# Patient Record
Sex: Female | Born: 1935 | Race: White | Hispanic: No | Smoking: Never smoker
Health system: Southern US, Community
[De-identification: ages and names within clinical notes are randomized; demographics above are authoritative.]

## PROBLEM LIST (undated history)

## (undated) DIAGNOSIS — E039 Hypothyroidism, unspecified: Secondary | ICD-10-CM

## (undated) DIAGNOSIS — M797 Fibromyalgia: Secondary | ICD-10-CM

## (undated) DIAGNOSIS — I779 Disorder of arteries and arterioles, unspecified: Secondary | ICD-10-CM

## (undated) DIAGNOSIS — R0602 Shortness of breath: Secondary | ICD-10-CM

## (undated) DIAGNOSIS — I2510 Atherosclerotic heart disease of native coronary artery without angina pectoris: Secondary | ICD-10-CM

## (undated) DIAGNOSIS — G459 Transient cerebral ischemic attack, unspecified: Secondary | ICD-10-CM

## (undated) DIAGNOSIS — N189 Chronic kidney disease, unspecified: Secondary | ICD-10-CM

## (undated) DIAGNOSIS — E079 Disorder of thyroid, unspecified: Secondary | ICD-10-CM

## (undated) DIAGNOSIS — I10 Essential (primary) hypertension: Secondary | ICD-10-CM

## (undated) DIAGNOSIS — M48061 Spinal stenosis, lumbar region without neurogenic claudication: Secondary | ICD-10-CM

## (undated) DIAGNOSIS — M1990 Unspecified osteoarthritis, unspecified site: Secondary | ICD-10-CM

## (undated) DIAGNOSIS — I2699 Other pulmonary embolism without acute cor pulmonale: Secondary | ICD-10-CM

## (undated) DIAGNOSIS — I219 Acute myocardial infarction, unspecified: Secondary | ICD-10-CM

## (undated) DIAGNOSIS — I209 Angina pectoris, unspecified: Secondary | ICD-10-CM

## (undated) DIAGNOSIS — E7800 Pure hypercholesterolemia, unspecified: Secondary | ICD-10-CM

## (undated) DIAGNOSIS — D649 Anemia, unspecified: Secondary | ICD-10-CM

## (undated) HISTORY — PX: TUBAL LIGATION: SHX77

## (undated) HISTORY — PX: TONSILLECTOMY: SUR1361

## (undated) HISTORY — PX: APPENDECTOMY: SHX54

## (undated) HISTORY — PX: ABDOMINAL HYSTERECTOMY: SHX81

---

## 1967-01-31 DIAGNOSIS — I2699 Other pulmonary embolism without acute cor pulmonale: Secondary | ICD-10-CM

## 1967-01-31 HISTORY — PX: OTHER SURGICAL HISTORY: SHX169

## 1967-01-31 HISTORY — DX: Other pulmonary embolism without acute cor pulmonale: I26.99

## 1970-01-30 DIAGNOSIS — I2510 Atherosclerotic heart disease of native coronary artery without angina pectoris: Secondary | ICD-10-CM

## 1970-01-30 HISTORY — DX: Atherosclerotic heart disease of native coronary artery without angina pectoris: I25.10

## 1997-08-24 ENCOUNTER — Inpatient Hospital Stay (HOSPITAL_COMMUNITY): Admission: EM | Admit: 1997-08-24 | Discharge: 1997-08-25 | Payer: Self-pay | Admitting: Emergency Medicine

## 1998-06-30 ENCOUNTER — Encounter: Payer: Self-pay | Admitting: Surgery

## 1998-07-02 ENCOUNTER — Encounter: Payer: Self-pay | Admitting: Surgery

## 1998-07-02 ENCOUNTER — Ambulatory Visit (HOSPITAL_COMMUNITY): Admission: RE | Admit: 1998-07-02 | Discharge: 1998-07-03 | Payer: Self-pay | Admitting: Surgery

## 1998-11-03 ENCOUNTER — Other Ambulatory Visit: Admission: RE | Admit: 1998-11-03 | Discharge: 1998-11-03 | Payer: Self-pay | Admitting: Family Medicine

## 1998-12-27 ENCOUNTER — Ambulatory Visit (HOSPITAL_COMMUNITY): Admission: RE | Admit: 1998-12-27 | Discharge: 1998-12-27 | Payer: Self-pay | Admitting: Family Medicine

## 2001-11-20 ENCOUNTER — Encounter: Admission: RE | Admit: 2001-11-20 | Discharge: 2001-11-20 | Payer: Self-pay

## 2001-12-05 ENCOUNTER — Ambulatory Visit (HOSPITAL_COMMUNITY): Admission: RE | Admit: 2001-12-05 | Discharge: 2001-12-05 | Payer: Self-pay | Admitting: Neurosurgery

## 2001-12-05 ENCOUNTER — Encounter: Payer: Self-pay | Admitting: Neurosurgery

## 2001-12-20 ENCOUNTER — Encounter: Payer: Self-pay | Admitting: Neurosurgery

## 2001-12-20 ENCOUNTER — Encounter: Admission: RE | Admit: 2001-12-20 | Discharge: 2001-12-20 | Payer: Self-pay | Admitting: Neurosurgery

## 2002-01-03 ENCOUNTER — Encounter: Admission: RE | Admit: 2002-01-03 | Discharge: 2002-01-03 | Payer: Self-pay | Admitting: Neurosurgery

## 2002-01-03 ENCOUNTER — Encounter: Payer: Self-pay | Admitting: Neurosurgery

## 2002-01-16 ENCOUNTER — Encounter: Admission: RE | Admit: 2002-01-16 | Discharge: 2002-01-16 | Payer: Self-pay | Admitting: Neurosurgery

## 2002-01-16 ENCOUNTER — Encounter: Payer: Self-pay | Admitting: Neurosurgery

## 2002-03-05 ENCOUNTER — Encounter: Admission: RE | Admit: 2002-03-05 | Discharge: 2002-03-05 | Payer: Self-pay | Admitting: Family Medicine

## 2002-03-05 ENCOUNTER — Encounter: Payer: Self-pay | Admitting: Family Medicine

## 2002-10-10 ENCOUNTER — Encounter: Admission: RE | Admit: 2002-10-10 | Discharge: 2002-10-10 | Payer: Self-pay | Admitting: Nephrology

## 2002-10-10 ENCOUNTER — Encounter: Payer: Self-pay | Admitting: Nephrology

## 2002-10-21 ENCOUNTER — Encounter: Admission: RE | Admit: 2002-10-21 | Discharge: 2002-10-21 | Payer: Self-pay | Admitting: Neurosurgery

## 2002-10-21 ENCOUNTER — Encounter: Payer: Self-pay | Admitting: Radiology

## 2002-10-21 ENCOUNTER — Encounter: Payer: Self-pay | Admitting: Neurosurgery

## 2002-11-21 ENCOUNTER — Encounter: Payer: Self-pay | Admitting: Neurosurgery

## 2002-11-21 ENCOUNTER — Encounter: Admission: RE | Admit: 2002-11-21 | Discharge: 2002-11-21 | Payer: Self-pay | Admitting: Neurosurgery

## 2002-12-05 ENCOUNTER — Encounter: Admission: RE | Admit: 2002-12-05 | Discharge: 2002-12-05 | Payer: Self-pay | Admitting: Nephrology

## 2002-12-09 ENCOUNTER — Encounter: Admission: RE | Admit: 2002-12-09 | Discharge: 2002-12-09 | Payer: Self-pay | Admitting: Neurosurgery

## 2003-02-10 ENCOUNTER — Encounter: Admission: RE | Admit: 2003-02-10 | Discharge: 2003-02-10 | Payer: Self-pay | Admitting: Nephrology

## 2003-05-19 ENCOUNTER — Encounter: Admission: RE | Admit: 2003-05-19 | Discharge: 2003-05-19 | Payer: Self-pay | Admitting: Obstetrics and Gynecology

## 2003-09-20 ENCOUNTER — Emergency Department (HOSPITAL_COMMUNITY): Admission: EM | Admit: 2003-09-20 | Discharge: 2003-09-20 | Payer: Self-pay | Admitting: Emergency Medicine

## 2004-03-23 ENCOUNTER — Ambulatory Visit: Payer: Self-pay | Admitting: Oncology

## 2004-05-12 ENCOUNTER — Ambulatory Visit: Payer: Self-pay | Admitting: Oncology

## 2005-09-25 ENCOUNTER — Observation Stay (HOSPITAL_COMMUNITY): Admission: EM | Admit: 2005-09-25 | Discharge: 2005-09-26 | Payer: Self-pay | Admitting: Emergency Medicine

## 2006-11-01 ENCOUNTER — Other Ambulatory Visit: Payer: Self-pay | Admitting: Obstetrics and Gynecology

## 2006-11-28 ENCOUNTER — Encounter (INDEPENDENT_AMBULATORY_CARE_PROVIDER_SITE_OTHER): Payer: Self-pay | Admitting: Obstetrics and Gynecology

## 2006-11-28 ENCOUNTER — Inpatient Hospital Stay (HOSPITAL_COMMUNITY): Admission: RE | Admit: 2006-11-28 | Discharge: 2006-12-01 | Payer: Self-pay | Admitting: Obstetrics and Gynecology

## 2008-03-02 ENCOUNTER — Other Ambulatory Visit: Payer: Self-pay | Admitting: Emergency Medicine

## 2008-03-02 ENCOUNTER — Encounter: Payer: Self-pay | Admitting: Vascular Surgery

## 2008-03-02 ENCOUNTER — Ambulatory Visit: Payer: Self-pay | Admitting: Vascular Surgery

## 2008-03-02 ENCOUNTER — Inpatient Hospital Stay (HOSPITAL_COMMUNITY): Admission: AD | Admit: 2008-03-02 | Discharge: 2008-03-16 | Payer: Self-pay | Admitting: Vascular Surgery

## 2008-03-03 ENCOUNTER — Encounter: Payer: Self-pay | Admitting: Vascular Surgery

## 2008-03-04 ENCOUNTER — Encounter: Payer: Self-pay | Admitting: Vascular Surgery

## 2008-03-06 ENCOUNTER — Ambulatory Visit: Payer: Self-pay | Admitting: Physical Medicine & Rehabilitation

## 2008-04-01 ENCOUNTER — Ambulatory Visit: Payer: Self-pay | Admitting: Vascular Surgery

## 2008-05-13 ENCOUNTER — Ambulatory Visit: Payer: Self-pay | Admitting: Vascular Surgery

## 2008-06-10 ENCOUNTER — Ambulatory Visit: Payer: Self-pay | Admitting: Vascular Surgery

## 2008-07-22 ENCOUNTER — Ambulatory Visit: Payer: Self-pay | Admitting: Vascular Surgery

## 2008-09-30 ENCOUNTER — Ambulatory Visit: Payer: Self-pay | Admitting: Vascular Surgery

## 2009-03-24 ENCOUNTER — Ambulatory Visit: Payer: Self-pay | Admitting: Vascular Surgery

## 2009-09-15 ENCOUNTER — Ambulatory Visit: Payer: Self-pay | Admitting: Vascular Surgery

## 2009-11-09 ENCOUNTER — Ambulatory Visit: Payer: Self-pay | Admitting: Vascular Surgery

## 2010-02-19 ENCOUNTER — Encounter: Payer: Self-pay | Admitting: Nephrology

## 2010-02-19 ENCOUNTER — Encounter: Payer: Self-pay | Admitting: Neurosurgery

## 2010-05-11 ENCOUNTER — Encounter (INDEPENDENT_AMBULATORY_CARE_PROVIDER_SITE_OTHER): Payer: Self-pay

## 2010-05-11 DIAGNOSIS — I739 Peripheral vascular disease, unspecified: Secondary | ICD-10-CM

## 2010-05-11 DIAGNOSIS — Z48812 Encounter for surgical aftercare following surgery on the circulatory system: Secondary | ICD-10-CM

## 2010-05-17 LAB — CBC
HCT: 44.6 % (ref 36.0–46.0)
HCT: 22.9 % — ABNORMAL LOW (ref 36.0–46.0)
HCT: 23.8 % — ABNORMAL LOW (ref 36.0–46.0)
HCT: 25.4 % — ABNORMAL LOW (ref 36.0–46.0)
HCT: 25.7 % — ABNORMAL LOW (ref 36.0–46.0)
HCT: 27.3 % — ABNORMAL LOW (ref 36.0–46.0)
HCT: 27.4 % — ABNORMAL LOW (ref 36.0–46.0)
HCT: 27.7 % — ABNORMAL LOW (ref 36.0–46.0)
HCT: 30.3 % — ABNORMAL LOW (ref 36.0–46.0)
HCT: 31.5 % — ABNORMAL LOW (ref 36.0–46.0)
Hemoglobin: 15 g/dL (ref 12.0–15.0)
Hemoglobin: 10.1 g/dL — ABNORMAL LOW (ref 12.0–15.0)
Hemoglobin: 11.1 g/dL — ABNORMAL LOW (ref 12.0–15.0)
Hemoglobin: 6.6 g/dL — CL (ref 12.0–15.0)
Hemoglobin: 8.3 g/dL — ABNORMAL LOW (ref 12.0–15.0)
Hemoglobin: 8.4 g/dL — ABNORMAL LOW (ref 12.0–15.0)
Hemoglobin: 9 g/dL — ABNORMAL LOW (ref 12.0–15.0)
Hemoglobin: 9.6 g/dL — ABNORMAL LOW (ref 12.0–15.0)
MCHC: 33.5 g/dL (ref 30.0–36.0)
MCHC: 33 g/dL (ref 30.0–36.0)
MCHC: 33.1 g/dL (ref 30.0–36.0)
MCHC: 33.3 g/dL (ref 30.0–36.0)
MCHC: 33.4 g/dL (ref 30.0–36.0)
MCHC: 33.7 g/dL (ref 30.0–36.0)
MCHC: 34.2 g/dL (ref 30.0–36.0)
MCHC: 34.3 g/dL (ref 30.0–36.0)
MCHC: 34.5 g/dL (ref 30.0–36.0)
MCHC: 34.6 g/dL (ref 30.0–36.0)
MCHC: 34.7 g/dL (ref 30.0–36.0)
MCHC: 35.1 g/dL (ref 30.0–36.0)
MCV: 91.8 fL (ref 78.0–100.0)
MCV: 86.7 fL (ref 78.0–100.0)
MCV: 86.9 fL (ref 78.0–100.0)
MCV: 87.5 fL (ref 78.0–100.0)
MCV: 87.5 fL (ref 78.0–100.0)
MCV: 87.9 fL (ref 78.0–100.0)
MCV: 87.9 fL (ref 78.0–100.0)
MCV: 88.3 fL (ref 78.0–100.0)
MCV: 90.6 fL (ref 78.0–100.0)
MCV: 92 fL (ref 78.0–100.0)
MCV: 92.7 fL (ref 78.0–100.0)
MCV: 92.9 fL (ref 78.0–100.0)
Platelets: 334 10*3/uL (ref 150–400)
Platelets: 105 10*3/uL — ABNORMAL LOW (ref 150–400)
Platelets: 107 10*3/uL — ABNORMAL LOW (ref 150–400)
Platelets: 141 10*3/uL — ABNORMAL LOW (ref 150–400)
Platelets: 182 10*3/uL (ref 150–400)
Platelets: 213 10*3/uL (ref 150–400)
Platelets: 231 10*3/uL (ref 150–400)
Platelets: 254 10*3/uL (ref 150–400)
Platelets: 268 10*3/uL (ref 150–400)
Platelets: 271 10*3/uL (ref 150–400)
Platelets: 277 10*3/uL (ref 150–400)
Platelets: 289 10*3/uL (ref 150–400)
Platelets: 297 10*3/uL (ref 150–400)
Platelets: 314 10*3/uL (ref 150–400)
Platelets: 429 10*3/uL — ABNORMAL HIGH (ref 150–400)
RBC: 4.86 MIL/uL (ref 3.87–5.11)
RBC: 2.13 MIL/uL — ABNORMAL LOW (ref 3.87–5.11)
RBC: 2.73 MIL/uL — ABNORMAL LOW (ref 3.87–5.11)
RBC: 2.94 MIL/uL — ABNORMAL LOW (ref 3.87–5.11)
RBC: 3.13 MIL/uL — ABNORMAL LOW (ref 3.87–5.11)
RBC: 3.26 MIL/uL — ABNORMAL LOW (ref 3.87–5.11)
RBC: 3.61 MIL/uL — ABNORMAL LOW (ref 3.87–5.11)
RDW: 13.9 % (ref 11.5–15.5)
RDW: 13.9 % (ref 11.5–15.5)
RDW: 14.1 % (ref 11.5–15.5)
RDW: 14.3 % (ref 11.5–15.5)
RDW: 15.1 % (ref 11.5–15.5)
RDW: 15.2 % (ref 11.5–15.5)
RDW: 15.3 % (ref 11.5–15.5)
RDW: 15.3 % (ref 11.5–15.5)
RDW: 15.4 % (ref 11.5–15.5)
RDW: 15.5 % (ref 11.5–15.5)
RDW: 15.7 % — ABNORMAL HIGH (ref 11.5–15.5)
RDW: 16 % — ABNORMAL HIGH (ref 11.5–15.5)
WBC: 11.9 10*3/uL — ABNORMAL HIGH (ref 4.0–10.5)
WBC: 12.7 10*3/uL — ABNORMAL HIGH (ref 4.0–10.5)
WBC: 13.6 10*3/uL — ABNORMAL HIGH (ref 4.0–10.5)
WBC: 15.2 10*3/uL — ABNORMAL HIGH (ref 4.0–10.5)
WBC: 15.9 10*3/uL — ABNORMAL HIGH (ref 4.0–10.5)
WBC: 17.7 10*3/uL — ABNORMAL HIGH (ref 4.0–10.5)
WBC: 18.4 10*3/uL — ABNORMAL HIGH (ref 4.0–10.5)
WBC: 22.7 10*3/uL — ABNORMAL HIGH (ref 4.0–10.5)
WBC: 28.8 10*3/uL — ABNORMAL HIGH (ref 4.0–10.5)
WBC: 8.5 10*3/uL (ref 4.0–10.5)
WBC: 8.6 10*3/uL (ref 4.0–10.5)

## 2010-05-17 LAB — PROTIME-INR
INR: 1 (ref 0.00–1.49)
INR: 1.1 (ref 0.00–1.49)
INR: 1.3 (ref 0.00–1.49)
INR: 1.4 (ref 0.00–1.49)
INR: 1.6 — ABNORMAL HIGH (ref 0.00–1.49)
INR: 2.2 — ABNORMAL HIGH (ref 0.00–1.49)
INR: 2.2 — ABNORMAL HIGH (ref 0.00–1.49)
INR: 2.5 — ABNORMAL HIGH (ref 0.00–1.49)
Prothrombin Time: 13.1 seconds (ref 11.6–15.2)
Prothrombin Time: 16.5 seconds — ABNORMAL HIGH (ref 11.6–15.2)
Prothrombin Time: 17.5 seconds — ABNORMAL HIGH (ref 11.6–15.2)
Prothrombin Time: 19.5 seconds — ABNORMAL HIGH (ref 11.6–15.2)
Prothrombin Time: 25.9 seconds — ABNORMAL HIGH (ref 11.6–15.2)
Prothrombin Time: 26.1 seconds — ABNORMAL HIGH (ref 11.6–15.2)
Prothrombin Time: 27.9 seconds — ABNORMAL HIGH (ref 11.6–15.2)
Prothrombin Time: 28.9 seconds — ABNORMAL HIGH (ref 11.6–15.2)

## 2010-05-17 LAB — CROSSMATCH
ABO/RH(D): A NEG
ABO/RH(D): A NEG
Antibody Screen: NEGATIVE
Antibody Screen: NEGATIVE

## 2010-05-17 LAB — HEPARIN LEVEL (UNFRACTIONATED)
Heparin Unfractionated: 0.24 IU/mL — ABNORMAL LOW (ref 0.30–0.70)
Heparin Unfractionated: 0.24 IU/mL — ABNORMAL LOW (ref 0.30–0.70)
Heparin Unfractionated: 0.26 IU/mL — ABNORMAL LOW (ref 0.30–0.70)
Heparin Unfractionated: 0.31 IU/mL (ref 0.30–0.70)
Heparin Unfractionated: 0.35 IU/mL (ref 0.30–0.70)
Heparin Unfractionated: 0.41 IU/mL (ref 0.30–0.70)
Heparin Unfractionated: 0.42 IU/mL (ref 0.30–0.70)
Heparin Unfractionated: <0.1 IU/mL — ABNORMAL LOW (ref 0.30–0.70)
Heparin Unfractionated: >2 IU/mL — ABNORMAL HIGH (ref 0.30–0.70)

## 2010-05-17 LAB — URINALYSIS, ROUTINE W REFLEX MICROSCOPIC
Glucose, UA: NEGATIVE mg/dL
Ketones, ur: NEGATIVE mg/dL
Ketones, ur: NEGATIVE mg/dL
Leukocytes, UA: NEGATIVE
Nitrite: NEGATIVE
Protein, ur: NEGATIVE mg/dL
Specific Gravity, Urine: 1.033 — ABNORMAL HIGH (ref 1.005–1.030)
Urobilinogen, UA: 1 mg/dL (ref 0.0–1.0)
pH: 7 (ref 5.0–8.0)

## 2010-05-17 LAB — CARDIOLIPIN ANTIBODIES, IGG, IGM, IGA
Anticardiolipin IgA: 7 [APL'U] — ABNORMAL LOW (ref <13)
Anticardiolipin IgM: <7 [MPL'U] — ABNORMAL LOW (ref <10)

## 2010-05-17 LAB — BASIC METABOLIC PANEL
BUN: 16 mg/dL (ref 6–23)
BUN: 40 mg/dL — ABNORMAL HIGH (ref 6–23)
CO2: 16 mEq/L — ABNORMAL LOW (ref 19–32)
CO2: 22 mEq/L (ref 19–32)
Calcium: 6.8 mg/dL — ABNORMAL LOW (ref 8.4–10.5)
Calcium: 7 mg/dL — ABNORMAL LOW (ref 8.4–10.5)
Calcium: 7.3 mg/dL — ABNORMAL LOW (ref 8.4–10.5)
Chloride: 100 mEq/L (ref 96–112)
Chloride: 108 mEq/L (ref 96–112)
Creatinine, Ser: 1.08 mg/dL (ref 0.4–1.2)
Creatinine, Ser: 1.55 mg/dL — ABNORMAL HIGH (ref 0.4–1.2)
Creatinine, Ser: 1.95 mg/dL — ABNORMAL HIGH (ref 0.4–1.2)
GFR calc Af Amer: 29 mL/min — ABNORMAL LOW (ref >60)
GFR calc Af Amer: 39 mL/min — ABNORMAL LOW (ref >60)
GFR calc non Af Amer: 24 mL/min — ABNORMAL LOW (ref >60)
GFR calc non Af Amer: 37 mL/min — ABNORMAL LOW (ref >60)
Glucose, Bld: 95 mg/dL (ref 70–99)
Potassium: 4.1 mEq/L (ref 3.5–5.1)
Potassium: 4.6 mEq/L (ref 3.5–5.1)
Sodium: 126 mEq/L — ABNORMAL LOW (ref 135–145)
Sodium: 133 mEq/L — ABNORMAL LOW (ref 135–145)
Sodium: 134 mEq/L — ABNORMAL LOW (ref 135–145)

## 2010-05-17 LAB — COMPREHENSIVE METABOLIC PANEL
ALT: 23 U/L (ref 0–35)
AST: 24 U/L (ref 0–37)
Albumin: 3.6 g/dL (ref 3.5–5.2)
Alkaline Phosphatase: 62 U/L (ref 39–117)
BUN: 40 mg/dL — ABNORMAL HIGH (ref 6–23)
CO2: 25 mEq/L (ref 19–32)
Calcium: 9.3 mg/dL (ref 8.4–10.5)
Chloride: 105 mEq/L (ref 96–112)
Creatinine, Ser: 1.38 mg/dL — ABNORMAL HIGH (ref 0.4–1.2)
GFR calc Af Amer: 44 mL/min — ABNORMAL LOW (ref >60)
GFR calc non Af Amer: 36 mL/min — ABNORMAL LOW (ref >60)
Glucose, Bld: 98 mg/dL (ref 70–99)
Potassium: 4.3 mEq/L (ref 3.5–5.1)
Sodium: 139 mEq/L (ref 135–145)
Total Bilirubin: 0.9 mg/dL (ref 0.3–1.2)
Total Protein: 6.6 g/dL (ref 6.0–8.3)

## 2010-05-17 LAB — RENAL FUNCTION PANEL
CO2: 18 mEq/L — ABNORMAL LOW (ref 19–32)
Chloride: 106 mEq/L (ref 96–112)
Glucose, Bld: 127 mg/dL — ABNORMAL HIGH (ref 70–99)
Phosphorus: 4 mg/dL (ref 2.3–4.6)
Potassium: 4.7 mEq/L (ref 3.5–5.1)
Sodium: 132 mEq/L — ABNORMAL LOW (ref 135–145)

## 2010-05-17 LAB — LUPUS ANTICOAGULANT PANEL
PTT Lupus Anticoagulant: 200 secs — ABNORMAL HIGH (ref 36.3–48.8)
PTTLA 4:1 Mix: 200 secs — ABNORMAL HIGH (ref 36.3–48.8)
PTTLA Confirmation: 5.5 secs (ref <8.0)
dRVVT Incubated 1:1 Mix: 39 secs (ref 36.1–47.0)

## 2010-05-17 LAB — URINE MICROSCOPIC-ADD ON

## 2010-05-17 LAB — DIFFERENTIAL
Eosinophils Absolute: 0.2 10*3/uL (ref 0.0–0.7)
Eosinophils Relative: 1 % (ref 0–5)
Monocytes Absolute: 1.8 10*3/uL — ABNORMAL HIGH (ref 0.1–1.0)
Neutrophils Relative %: 58 % (ref 43–77)

## 2010-05-17 LAB — PROTEIN C ACTIVITY: Protein C Activity: 114 % (ref 75–133)

## 2010-05-17 LAB — BETA-2-GLYCOPROTEIN I ABS, IGG/M/A
Beta-2 Glyco I IgG: <4 U/mL (ref <20)
Beta-2-Glycoprotein I IgA: 4 U/mL (ref <10)
Beta-2-Glycoprotein I IgM: <4 U/mL (ref <10)

## 2010-05-17 LAB — HOMOCYSTEINE: Homocysteine: 8.8 umol/L (ref 4.0–15.4)

## 2010-05-17 LAB — ANTITHROMBIN III: AntiThromb III Func: 72 % — ABNORMAL LOW (ref 76–126)

## 2010-05-17 LAB — FACTOR 5 LEIDEN

## 2010-05-17 LAB — SAMPLE TO BLOOD BANK

## 2010-05-17 LAB — PREPARE RBC (CROSSMATCH)

## 2010-05-17 LAB — APTT
aPTT: 25 seconds (ref 24–37)
aPTT: 102 seconds — ABNORMAL HIGH (ref 24–37)

## 2010-05-17 LAB — ABO/RH: ABO/RH(D): A NEG

## 2010-05-17 LAB — PROTEIN S ACTIVITY: Protein S Activity: 55 % — ABNORMAL LOW (ref 69–129)

## 2010-05-17 LAB — GLUCOSE, CAPILLARY: Glucose-Capillary: 111 mg/dL — ABNORMAL HIGH (ref 70–99)

## 2010-05-17 LAB — PROTEIN S, TOTAL: Protein S Ag, Total: 52 % — ABNORMAL LOW (ref 70–140)

## 2010-06-14 NOTE — Assessment & Plan Note (Signed)
OFFICE VISIT   Katherine Cuevas, Katherine Cuevas  DOB:  Oct 30, 1919                                       04/01/2008  FAOZH#:[id number]   The patient is an 77 year old female who underwent embolectomy of her  right leg in early February with bilateral fasciotomies.  She presents  for followup in the office today.  She was discharged to a nursing home  with [facility] therapy to shrink the fasciotomy wounds.  She states that she  is beginning to walk some with a walker but still has a fair amount of  numbness in her right heel.  She has significantly improved since her  discharge from the hospital.   PHYSICAL EXAMINATION:  On physical exam today blood pressure is 120/65  in the left arm, pulse is 60 and regular.  The medial and lateral  fasciotomy sites are healing well with good granulation tissue in the  base.  She still has some edema in the right foot but she has a 2+  palpable right dorsalis pedis pulse.  She is on Coumadin and states that  her levels have been good.   Overall, the patient continues to heal.  The blisters on her foot have  totally resolved.  She will continue VAC therapy and follow up with me  in one month's time.   Janetta Hora. Fields, MD  Electronically Signed   CEF/MEDQ  D:  04/01/2008  T:  04/02/2008  Job:  [id number]

## 2010-06-14 NOTE — Assessment & Plan Note (Signed)
OFFICE VISIT   UNITA, DETAMORE  DOB:  [birth year]                                       07/22/2008  ZOXWR#:[id number]   The patient returns for followup today.  She was last seen in May of  2010.  She underwent thrombectomy of her right popliteal and tibial  arteries in February of 2010.   On exam today her fasciotomies have completely healed at this point.  Her feet are pink, warm and well-perfused.  Overall she is doing well.  She will follow up in six months' time for repeat ABIs.  She will  continue to take her Coumadin 4 mg daily.  Blood pressure today is  148/79, pulse 83 and regular.   Janetta Hora. Fields, MD  Electronically Signed   CEF/MEDQ  D:  07/22/2008  T:  07/23/2008  Job:  [id number]

## 2010-06-14 NOTE — Consult Note (Signed)
NAME:  Katherine Cuevas, Katherine Cuevas NO.:  [id number]   MEDICAL RECORD NO.:  [id number]          PATIENT TYPE:  INP   LOCATION:  3316                         FACILITY:  [facility]   PHYSICIAN:  Peter M. Swaziland, M.D.  DATE OF BIRTH:  Feb 14, 1919   DATE OF CONSULTATION:  03/03/2008  DATE OF DISCHARGE:                                 CONSULTATION   HISTORY OF PRESENT ILLNESS:  Katherine Cuevas is a pleasant 77 year old white  female who is well known to me.  She was last seen in October 2008.  She  was admitted yesterday with acute ischemia to her right leg.  Arteriogram was consistent with embolus to the right SFA.  She underwent  embolectomy yesterday.  Last night, she had continued bleeding from her  fasciotomy site, and her heparin was discontinued.  Today, she developed  recurrent thrombosis of the right leg, and underwent repeat surgery with  thrombectomy at the right popliteal and tibial vessels with vein patch  angioplasty of the right peroneal trunk.  Katherine Cuevas has no known  history of arterial embolus.  She has no history of atrial fibrillation.  She has had chest pain off and on over the years.  She had normal  cardiac catheterizations in 1996 and 1999.  She had an adenosine  Cardiolite study in October 2008, that was normal with an ejection  fraction of 72%.  Her last echocardiogram in 2002 was normal.  She did  complain of some palpitations in 2004, but Holter monitor at that time  showed only rare PACs and PVCs.   PAST MEDICAL HISTORY:  1. Hypothyroidism.  2. History of pulmonary emboli, and she subsequently underwent a vena      cava ligation.  3. Gout.  4. GERD.  5. Hypertension.  6. Lumbar stenosis.  7. Chronic renal insufficiency.  8. Fibromyalgia.   PRIOR SURGERIES:  TAHBSO, vena cava ligation, cholecystectomy,  appendectomy, T&A, and thyroidectomy.   ALLERGIES:  She is allergic to CODEINE, MORPHINE, and DILAUDID.   MEDICATIONS PRIOR TO ADMISSION:  1.  Allopurinol 300 mg per day.  2. Multivitamin daily.  3. Lescol 80 mg per day.  4. Norvasc 5 mg daily.  5. Synthroid 50 mcg per day.  6. Torsemide 20 mg per day.  7. Prilosec daily.   SOCIAL HISTORY:  The patient lives alone.  She has 6 children.  She  denies tobacco or alcohol use.   FAMILY HISTORY:  Father died at age 4 of nephritis.  Mother died in her  46s with coronary disease.   REVIEW OF SYSTEMS:  Otherwise unobtainable as the patient is sedated  immediately, postoperatively.   PHYSICAL EXAMINATION:  GENERAL:  She is a well-developed white female  who is sedated.  She appears pale.  HEENT:  She is normocephalic and atraumatic.  Her pupils are equal,  round, and reactive to light.  NECK:  Supple without JVD, adenopathy, thyromegaly, or bruits.  She has  an old thyroidectomy scar.  LUNGS:  Clear anteriorly.  CARDIAC:  Regular rate and rhythm with the grade 1-2/6 high pitch apical  systolic murmur.  ABDOMEN:  Soft and nontender.  Bowel sounds are positive.  EXTREMITIES:  Her right leg is dressed with the surgical dressing.  She  has good posterior tibial pulses and absent DP pulses.  Her left foot is  warm and dry with good pulses.  NEUROLOGIC:  Unobtainable other than globally she moves all extremities.   LABORATORY DATA:  ECG showed normal sinus rhythm, left anterior  fascicular block, right bundle-branch block.  These findings are  chronic.  White count is 22,700, hemoglobin 7.9, hematocrit 23.8, and  platelets 213,000.  Protime was 19.3 with an INR of 1.6.  Heparin level  is greater than 2.  Sodium is 133, potassium 4.6, chloride 100, CO2 of  24, BUN 40, creatinine 1.55, and glucose of 134.   IMPRESSION:  1. Acute arterial embolus to the right leg.  Source is unknown.  No      known history of atrial fibrillation.  2. Chronic chest pain with negative cardiac workup in the past.  3. Hypertension.  4. Gout.  5. Gastroesophageal reflux disease.   PLAN:  We will  obtain an echocardiogram to evaluate possible cardiac  source of embolus.  We will also obtain hypercoagulable panel.  May need  to consider transesophageal echo at some point depending on her  recovery.  We need to monitor for any arrhythmia.  We will need to  decide whether the patient needs to go on Coumadin therapy or not versus  antiplatelet therapy.  We will await her workup before making this  decision.           ______________________________  Peter M. [place], M.D.     PMJ/MEDQ  D:  03/03/2008  T:  03/04/2008  Job:  [id number]   cc:   Janetta Hora. Darrick Penna, MD

## 2010-06-14 NOTE — Assessment & Plan Note (Signed)
OFFICE VISIT   LAKYLA, BISWAS  DOB:  [birth year]                                       05/13/2008  WUJWJ#:[id number]   The patient returns for followup today.  She had an embolectomy of her  right leg on March 03, 2008.  She returns today for wound check of her  fasciotomy sites.   PHYSICAL EXAMINATION:  VITAL SIGNS:  Today, blood pressure 174/75 in the  left arm, pulse is 88 and regular.  EXTREMITIES:  Lower extremity exam shows that the lateral fasciotomy has  now completely healed.  There is a 3 x 8-cm open wound on the medial  fasciotomy site, but this has a very good looking bed of granulation  tissue and this is essentially flat with the normal epithelium  surrounding it.  Silver nitrate was applied to the granulation tissue  today so that the skin can continue to further epithelialize over this.  Her left foot has no ulcerations.  She has biphasic posterior tibial  Doppler flow and monophasic dorsalis pedis Doppler flow.  Blisters on  her leg are completely healed, although she does have two discolored  areas of skin where these blisters healed up.   We will stop her VAC dressing at this time and go to hydrogel dressings.  She will follow up with me in one months' time.  Hopefully, all of her  wounds will be completely healed at that time.  We will need to get ABIs  on her in May.   Janetta Hora. Fields, MD  Electronically Signed   CEF/MEDQ  D:  05/13/2008  T:  05/14/2008  Job:  [id number]

## 2010-06-14 NOTE — H&P (Signed)
NAME:  Katherine Cuevas, COBERN                ACCOUNT NO.:  [id number]   MEDICAL RECORD NO.:  [id number]          PATIENT TYPE:  AMB   LOCATION:  SDC                           FACILITY:  [facility]   PHYSICIAN:  Charles A. Delcambre, MDDATE OF BIRTH:  15-Nov-1919   DATE OF ADMISSION:  11/05/2006  DATE OF DISCHARGE:                              HISTORY & PHYSICAL   Patient is to be admitted on October 6th to undergo transabdominal  hysterectomy and bilateral salpingo-oophorectomy for a pelvic mass that  has been followed but is growing slowly.  She is an 77 year old.  Ultrasound has shown this mass to increase very slowly, 4+ cm, 5 cm, 5.5  cm, now 5.7 cm with a tiny echogenic area, according to ultrasound on  September 18, 2006.  We did draw serum markers.  CA-125 was 7.1 on August 27, 2006.  CA-125 on May 06, 2003 was 9.6.  I have discussed this with Dr.  Jenita Seashore in the past and recently with Dr. Clifton James, GYN oncologist.  She  feels this is very likely a benign lesion and recommends that I proceed  with surgery, should the patient want to have surgery here.  I did  discuss the patient's surgery with Dr. Leilani Able at [facility], who  agreed that we could do her surgery at [facility] as well with no major  medical issues and generally being of good health, see medical history,  as noted above.   She has left lower quadrant pain now, not severe in nature but  increasing, and if she does have severe pain, we will proceed with  surgery.   PAST MEDICAL HISTORY:  1. Fibromyalgia.  2. Lumbar vertebral stenosis with a protruding disk.  3. Chronic back pain.  4. Chronic renal disease, she states, but creatinine has recently been      1.8 or 1.5.  5. Hypertension.  6. Hypercholesterolemia.  7. Gout.  8. Hypothyroidism after thyroidectomy.  9. Sudden endocardial MI in 1972.  She recently had cardiac      catheterization about three years, which was negative.  She has no      chest pain at this time.   PAST  SURGICAL HISTORY:  1. Tonsillectomy.  2. Appendectomy.  3. Tubal ligation.  4. She has a vena cava umbrella placement secondary to a pulmonary      embolus in 1969.  She is on no Coumadin.   MEDICATIONS:  1. Armour thyroid 30 mg daily.  2. Furosemide 20 mg daily.  3. Lescol XL 80 mg daily.  4. Allopurinol 300 mg daily.  5. Norvasc 5 mg daily.  6. Multivitamin with iron.  7. Baby aspirin 81 mg per day.  She will stop this at this time.  8. Tramadol 50 mg q.i.d. as needed for pain on a chronic basis.  9. Prilosec 1 tablet a day.   ALLERGIES:  Sensitive to CODEINE, MORPHINE, and DILAUDID.  Not strictly  allergic by history.   SOCIAL HISTORY:  Widowed.  Not sexually active.  Denies tobacco,  ethanol, or drug use.   FAMILY HISTORY:  Father  deceased, age 83, of glomerular nephritis.  Mother with congestive heart failure.  Both deceased at ages 6 and 20,  respectively.  Sister deceased, age 24, congestive heart failure.  Brother deceased, age 49, congestive heart failure.  Brother and sister  each had diabetes and one of them had renal failure as well.  No family  history of ovarian cancer.   REVIEW OF SYSTEMS:  She has some fluid collection in the left adnexa but  no ascites on ultrasound.  Denies fevers or chills, rashes, lesions,  headaches, dizziness.  She does have some seasonal allergies.  No chest  pain, shortness of breath, or wheezing.  No diarrhea, constipation,  bleeding, melena, hematochezia, urgency, frequency, dysuria,  incontinence, hematuria, galactorrhea, or emotional changes.   PHYSICAL EXAMINATION:  Alert and oriented x3.  In no distress.  Blood pressure 124/70, heart rate 88, respirations 20.  Afebrile.  HEENT:  Grossly within normal limits.  NECK:  Supple without thyromegaly or adenopathy.  LUNGS:  Clear bilaterally.  BREASTS:  No masses, tenderness, discharge, skin, or nipple changes.  HEART:  Regular rate and rhythm without murmur.  ABDOMEN:  Mild left  lower quadrant tenderness to deep palpation.  No  mass palpable.  No guarding, rebound, or peritoneal signs.  No  hepatosplenomegaly.  PELVIC:  Vaginal probe ultrasound had significant discomfort.  Even  caused some bleeding after the ultrasound.  A speculum was placed.  It  does appear that she would have a large enough introitus to introduce a  vaginal probe ultrasound.  The vagina is hypoestrogenic.   LABORATORY:  CA-125 as noted above.  BUN 26, creatinine 1.5.  Glomerular  filtration rate, Caucasian, 33 ml/min.   ASSESSMENT:  Increasing pelvic mass and now with symptomatic with pelvic  pain.  Discussed with GYN oncology, and they consider that she is a low  risk candidate as far as cancer goes and would consent, as patient  requests, to have surgery done at [facility].  Dr. Arby Barrette also agrees that  patient could be done at [facility].  Of course, we will get a preop prior  to surgery, and if they pick up something of significance, we will move  her surgery to [facility].  All questions are answered.  She gives  informed consent except for the risk of infection, bleeding, bowel and  bladder damage, blood product risks, including hepatitis and HIV  exposure, ureteral damage, the possibility of cancer, limited at her age  to go onto staging adenopathy, but we could proceed with omentectomy,  biopsies, and washings.  All questions are answered, and we will proceed  with surgery.  We will have her do a bowel prep with Moviprep prior to  surgery.  I start this about 3 p.m. and thereafter clear liquids until  surgery.  She is currently scheduled for 11:30.  She can have clear  liquids up until 5 in the morning.  Otherwise, per anesthesia.   A preoperative CMET was done today.  We will get a copy to the hospital.  At the time of surgery, CBC and type and screen will be done.  SCDs will  be used to the thigh.  She will follow up as directed.      Charles A. Sydnee Cabal, MD  Electronically  Signed     CAD/MEDQ  D:  10/24/2006  T:  10/24/2006  Job:  [id number]

## 2010-06-14 NOTE — Op Note (Signed)
NAME:  Katherine Cuevas, Katherine Cuevas NO.:  [id number]   MEDICAL RECORD NO.:  [id number]          PATIENT TYPE:  INP   LOCATION:  3316                         FACILITY:  [facility]   PHYSICIAN:  Janetta Hora. Fields, MD  DATE OF BIRTH:  10-Feb-1919   DATE OF PROCEDURE:  DATE OF DISCHARGE:                               OPERATIVE REPORT   PROCEDURE:  Aortogram with right lower extremity runoff.   PREOPERATIVE DIAGNOSIS:  Ischemia right lower extremity.   POSTOPERATIVE DIAGNOSIS:  Ischemia right lower extremity.   ANESTHESIA:  Local.   OPERATIVE DETAILS:  After obtaining informed consent, the patient was  taken to the PV lab.  The patient was placed in supine position on the  angio table.  Both groins were prepped and draped in usual sterile  fashion.  Local anesthesia was infiltrated over the left common femoral  artery.  A Majestic needle was used to cannulate the left common femoral  artery and a 0.035 Wholey wire was advanced into the abdominal aorta  under fluoroscopic guidance.  Next, a 5-French sheath was placed over  the guidewire in the left common femoral artery.  This was thoroughly  flushed with heparinized saline.  A 5-French pigtail catheter was then  placed over the guidewire and advanced up into the abdominal aorta.  Abdominal aortogram was obtained.  This shows bilateral single patent  left and right renal arteries.  The infrarenal abdominal aorta is smooth  in character with mild tortuosity.  The left common internal and  external ileac arteries are patent.  The right common internal and  external ileac arteries are patent.  Next, a right lower extremity  runoff view was obtained.  This was done by first removing the 5-French  pigtail catheter.  A 5-French crossover catheter was used to selectively  catheterize the right common ileac artery.  The [facility] wire was then  advanced down into the right external ileac artery under fluoroscopic  guidance.  Crossover catheter  was removed and a 4-French straight  catheter was placed over the guidewire in the proximal right external  ileac artery.  Right lower extremity arteriogram was obtained.  This  shows that the right common femoral and profunda femoris arteries are  patent.  Proximal portion of the right superficial femoral artery is  patent.  However, there was a large amount of what appears to be fresh  appearing thrombus or embolic material throughout the entire course of  the right superficial femoral artery.  The popliteal artery does fill  with contrast.  The anterior tibial artery is patent throughout its  course.  There is an abrupt cutoff sign at the proximal aspect of the  tibioperoneal trunk suggesting that this has embolic material within it  as well.  Next, the 4-French straight catheter was removed over a  guidewire.  The 5-French sheath was then pulled and hemostasis obtained  with direct pressure.  The patient tolerated the procedure well and  there were no complications.  The patient was taken to the preoperative  holding area for the operating room post procedure.   OPERATIVE  FINDINGS:  1. Right superficial femoral artery embolus.  2. Right tibioperoneal trunk embolus.      Janetta Hora. Fields, MD  Electronically Signed     CEF/MEDQ  D:  03/02/2008  T:  03/03/2008  Job:  [id number]

## 2010-06-14 NOTE — Assessment & Plan Note (Signed)
OFFICE VISIT   Katherine Cuevas, Katherine Cuevas  DOB:  02/28/1919                                       11/09/2009  ZOXWR#:[id number]   Patient is a patient of Dr. Miguel Aschoff, who called today because the  patient is complaining of acute onset of pain in her right calf last  evening.  She has been able to ambulate, states the calf does not feel  right.  She has a complicated vascular history and was treated by Dr.  Darrick Penna in February 2011 where she had a thrombectomy of her right  popliteal and tibial arteries with vein patch angioplasty,  intraoperative thrombolysis, and required a fasciotomy.  Eventually that  all healed, and most recently her ABIs were normal in August of this  year.  She denies any severe pain in the toes or numbness other than the  chronic numbness which she experienced since her surgery.   CHRONIC MEDICAL PROBLEMS:  1. Coronary artery disease, previous SEMI.  2. Hypertension.  3. Hyperlipidemia.  4. Negative for stroke, COPD.   SOCIAL HISTORY:  She does not use tobacco or alcohol.  She lives alone,  has 6 children.   FAMILY HISTORY:  Father died of nephritis.  Mother died of coronary  artery disease.   REVIEW OF SYSTEMS:  Denies any chest pain, dyspnea on exertion,  orthopnea, chronic bronchitis, hemoptysis, pulmonary symptoms.  All  other systems are negative in review of systems.   PHYSICAL EXAMINATION:  Blood pressure 147/85, heart rate 90, temperature  97.4.  Generally, she is an elderly female in no apparent distress,  alert and oriented x3.  HEENT:  Normal for age.  EOMs intact.  Chest:  Clear to auscultation.  No rhonchi or wheezing.  Cardiovascular:  Regular rhythm.  No murmurs.  Abdomen:  Obese.  No palpable masses.  Lower extremity exam reveals 3+ femoral pulses.  Right foot has a 2+  dorsalis pedis.  Left foot has a 1-2+ dorsalis pedis.  Both feet are  pink and well-perfused.  The fasciotomy sites in the right leg remain  well-healed.  She does have slight decreased sensation in the right foot  with good motion.   Today I ordered a lower extremity exam with segmental pressures and  ABIs.  These are unchanged from her study 2 months ago with triphasic  waveforms and normal ABIs.   I reassured her regarding these findings.  She will return on a regular  schedule in 6 months for protocol follow-up for lower extremity disease.     Quita Skye Hart Rochester, M.D.  Electronically Signed   JDL/MEDQ  D:  11/09/2009  T:  11/10/2009  Job:  [id number]

## 2010-06-14 NOTE — Op Note (Signed)
NAME:  Katherine Cuevas, Katherine Cuevas NO.:  [id number]   MEDICAL RECORD NO.:  [id number]          PATIENT TYPE:  INP   LOCATION:  3316                         FACILITY:  [facility]   PHYSICIAN:  Janetta Hora. Fields, MD  DATE OF BIRTH:  [birth year]   DATE OF PROCEDURE:  03/02/2008  DATE OF DISCHARGE:                               OPERATIVE REPORT   PROCEDURES:  1. Embolectomy of right superficial femoral artery.  2. Embolectomy of anterior tibial artery and tibioperoneal trunk right      leg.  3. Four compartment fasciotomy  4. Intraoperative arteriogram x1.   PREOPERATIVE DIAGNOSIS:  Embolus right leg.   POSTOPERATIVE DIAGNOSIS:  Embolus right leg.   ANESTHESIA:  General.   ASSISTANT:  Wilmon Arms, PA-C   FINDINGS:  Embolus right superficial femoral artery and tibioperoneal  trunk.   SPECIMENS:  Embolus right leg.   OPERATIVE DETAILS:  After obtaining informed consent, the patient was  taken to the operating room.  The patient was placed in supine position  on the  operating room table.  After induction of general anesthesia and  endotracheal intubation, the patient's entire right lower extremity was  prepped and draped in usual sterile fashion.  A longitudinal incision  was made in the right leg on the medial aspect just below the knee.  Incision was carried down through the subcutaneous tissues.  The greater  saphenous vein was dissected free circumferentially and small side  branches ligated and divided between silk ties.  Next, the vein was  reflected posteriorly and the incision was deepened down through the  fascia and the popliteal space was entered.  The popliteal artery was  dissected free circumferentially.  Small side branches were ligated  between clips.  Dissection was carried down to the takeoff of the  anterior tibial artery and tibioperoneal trunk.  Vessel loops were  placed around these vessels.  There was no pulse within the popliteal  artery.  The  vessel loop was placed around this as well.  The patient  had been given 7000 units of intravenous heparin on induction.  The  patient was given an additional 3000 units of heparin during the course  of the case.  Proximal control of the popliteal artery was obtained with  a fine bulldog clamp.  The anterior tibial artery and tibioperoneal  trunk was also controlled with fine bulldog clamps.  A transverse  arteriotomy was then made in the popliteal artery just above the takeoff  of the anterior tibial artery.  A #4 Fogarty catheter was then pressed  proximally all the way up to the level of the groin.  Multiple passes  were made with return of a large amount of thrombotic and embolic  material.  This was sent to pathology as a specimen.  Fogarty catheter  was passed until two clean passes were obtained.  This was then  thoroughly flushed.  This was then reoccluded proximally with fine  bulldog clamp.  #4 Fogarty catheter was then passed down the  tibioperoneal trunk in multiple passes and a large amount of thrombotic  and embolic material was removed.  This was performed on multiple passes  until two clean passes were obtained.  This was then reoccluded with a  fine bulldog clamp.  Next, an attempt was made to selectively  catheterize the anterior tibial artery.  A #3 Fogarty catheter would  only pass for approximately 10 cm.  Therefore, a #3 and #2 Fogarty  catheters were attempted to pass down the anterior tibial artery but  these were also passed for approximately 10 cm and was thought to be due  to intense spasm.  There was very good almost pulsatile backbleeding  from the anterior tibial artery.  At this point, this was reoccluded  with a fine bulldog clamp and the arteriotomy closed with interrupted 7-  0 Prolene sutures.  Just prior to completion of anastomosis, this was  fore-bled, back-bled and thoroughly flushed.  Anastomosis was secured,  clamps released, there was a pulsatile  flow in the popliteal artery  immediately.  There was biphasic Doppler flow in the anterior tibial and  tibioperoneal trunk immediately.  There was also a biphasic posterior  tibial Doppler signal, a monophasic dorsalis pedis Doppler, her anterior  tibial Doppler signal at the ankle and a triphasic peroneal Doppler  signal at the ankle.  Next, intraoperative arteriogram was performed by  introducing a 21 gauge butterfly needle into the mid popliteal artery  with inflow occlusion.  Contrast arteriogram was obtained with 15 mL of  contrast.  This shows a patent popliteal artery with patent anterior  tibial, posterior tibial and peroneal arteries all the way to the level  of the ankle.  The 21-gauge butterfly needle was then removed and a hole  repaired with a single 7-0 Prolene suture.  Next, a fasciotomy was  performed on the medial aspect of the leg to fully open the posterior  and posterior deep compartments.  A lateral and anterior compartment  fasciotomy was then also performed through a separate incision on the  lateral aspect of the leg.  There was some swelling of the anterior  compartment.  The muscle tissues of all four compartments however were  viable in appearance.  The patient tolerated the procedure well and  there were no complications.  Instrument, sponge, and needle counts were  correct at the end of the case.  The patient had a palpable dorsalis  pedis and posterior tibial pulse at the end of the case.      Janetta Hora. Fields, MD  Electronically Signed     CEF/MEDQ  D:  03/02/2008  T:  03/03/2008  Job:  [id number]

## 2010-06-14 NOTE — Op Note (Signed)
NAME:  Katherine Cuevas, FUNNELL NO.:  [id number]   MEDICAL RECORD NO.:  [id number]          PATIENT TYPE:  INP   LOCATION:  3316                         FACILITY:  [facility]   PHYSICIAN:  Janetta Hora. Fields, MD  DATE OF BIRTH:  Mar 20, 1919   DATE OF PROCEDURE:  03/03/2008  DATE OF DISCHARGE:                               OPERATIVE REPORT   PROCEDURES:  1. Thrombectomy of right popliteal and tibial arteries.  2. Vein patch angioplasty of right tibioperoneal trunk.  3. Intra-arterial thrombolysis.  4. Intraoperative arteriogram x2.   OPERATIVE FINDINGS:  1. Thrombosis of right popliteal, anterior tibial, peroneal, and      posterior tibial arteries.  2. Vein patch of tibioperoneal trunk.   ANESTHESIA:  General.   ASSISTANT:  Jerold Coombe, PA-C   OPERATIVE DETAILS:  After obtaining informed consent, the patient was  taken to the operating room.  The patient was placed in supine position  on the operating table.  After induction of general anesthesia and  endotracheal intubation, the patient's entire right lower extremity was  prepped and draped in the usual sterile fashion.  The patient's right  lower extremity was prepped and draped in the usual sterile fashion.  Preexisting medial below-knee incision was reopened and the popliteal  space entered.  Popliteal artery was dissected free circumferentially.  There was pulsatile flow in the popliteal artery up at the level of the  knee joint.  However, it was occluded distally.  A vessel loop was  placed around this.  Vessel loop was also placed around the anterior  tibial artery and tibioperoneal trunk.  Small bulldog clamps were used  to control the peroneal and posterior tibial arteries.  The patient was  given 5000 units of intravenous heparin.  The patient was also placed on  a continuous 900 units per hour heparin drip during the case.   Next, the preexisting arteriotomy in the distal popliteal artery was  reopened.   There was fresh thrombus within this.  A #4 Fogarty catheter  was used to thrombectomize the popliteal artery.  Catheter was passed  proximally 70 cm and a large amount of thrombus was removed.  There was  then pulsatile flow antegrade.  Two negative passes were obtained, and  the popliteal artery was reoccluded with vessel loop.  Next, a #2  Fogarty catheter was selectively passed down the anterior tibial artery.  As on the previous thrombectomy, I was only able to get approximately 3  cm down the anterior tibial artery before meeting obstruction either  secondary to spasm or stenosis.  There was some back bleeding from the  anterior tibial artery and a small amount of thrombus was removed from  the origin of the anterior tibial artery.  This was reoccluded with a  small bulldog clamp.  Next, #2 Fogarty catheter was passed down the  tibioperoneal trunk, multiple passes were made, and a large amount of  thrombotic material was removed.  A #4 Fogarty catheter was also passed  down the tibioperoneal trunk with return of some thrombus.  Catheters  were passed down  the tibioperoneal trunk till two clean passes were  obtained.  Next, the arteriotomy was reapproximated using interrupted 7-  0 Prolene sutures.  Intraoperative arteriogram was then performed with  two views, one of the mid leg and one of the foot.  These were each done  with 15 mL contrast.  These were both done with inflow occlusion.  First  film shows a patent popliteal artery with patent anterior tibial,  posterior tibial, and peroneal arteries.  There was nonfilling of the  posterior tibial artery at the level of the ankle.  The anterior tibial  artery is diminutive and thought to be in severe spasm or occluded.  The  peroneal artery is patent but also diminutive at the level of the ankle  and also thought to have some spasm.  At this point, it was decided that  the patient may have some retained thrombus at the posterior  tibial  artery of the ankle.  Therefore, dissection proceeded further down on  the tibioperoneal trunk.  Several branches of the popliteal vein were  ligated and divided between silk ties.  The peroneal artery and  posterior tibial arteries were dissected free circumferentially and  vessel loops placed around these.  The patient was given an additional  3000 units of heparin.  A longitudinal opening was then made in the  tibioperoneal trunk and #2 and #3 Fogarty catheters were passed down the  posterior tibial artery multiple times.  No thrombus was retrieved.  There was good arterial back bleeding from the posterior tibial artery  at this time.  It was decided at this time that as a final effort, t-PA  would be administered down the posterior tibial artery.  Two milligrams  of t-PA was diluted in 5 cc of heparinized saline and this was injected  directly into the posterior tibial artery origin.  This was then flushed  down with 60 mg of papaverine and heparinized saline.  This was allowed  to dwell for approximately 5 minutes.  The catheter was then removed  from posterior tibial artery, and this was reoccluded with a vessel  loop.  A short segment of greater saphenous vein was then harvested from  the distal calf.  This was clipped proximally and ligated with 2-0 silk  distally.  Small side branches were ligated between clips.  The vein was  opened longitudinally and placed in reversed configuration.  It was then  sewn on the tibioperoneal trunk as a patch angioplasty using running 6-0  Prolene suture.  Just prior to completion of the anastomosis, it was  fore bled, back bled, and thoroughly flushed.  Anastomosis was secured.  Clamps were all released.  There was good Doppler flow in the proximal  peroneal and posterior tibial arteries.  This was biphasic to triphasic  in character.  There was also good Doppler flow in the anterior tibial  artery and popliteal artery.  At the level of  the foot, there was a  biphasic posterior tibial artery Doppler signal as well as a monophasic  anterior tibial artery Doppler signal at the level of the ankle and a  biphasic peroneal Doppler signal at the level of the ankle.  I was  unable to obtain Doppler signals out in the foot proper.  Next,  hemostasis was obtained.  The fascia was reapproximated on medial aspect  of the leg with a running 2-0 Vicryl suture.  Skin was left open to pack  the fasciotomy site.  The lateral fasciotomy  wound had all of its nylon  sutures removed, and this was also packed in similar fashion.  The  patient tolerated the procedure well, and there were no complications.  Instrument, sponge, and needle counts were correct at the end of the  case.  The patient was taken to recovery room in stable condition.      Janetta Hora. Fields, MD  Electronically Signed     CEF/MEDQ  D:  03/03/2008  T:  03/04/2008  Job:  [id number]

## 2010-06-14 NOTE — Assessment & Plan Note (Signed)
OFFICE VISIT   AQUARIUS, TREMPER  DOB:  03/01/1919                                       06/10/2008  [id number]#:[id number]   The patient returns for followup today.  She was last seen 05/13/2008.  She previously underwent thrombectomy of right popliteal and tibial  arteries with a vein patch angioplasty of the tibioperoneal trunk on  03/03/2008.  She also had fasciotomies done during that hospital  admission.  She presents today for further followup.  She states that  she has been doing well overall and she has continued to do wound care  on the medial fasciotomy site of the right leg.   On exam today there is still some heaped up granulation tissue and this  was treated with silver nitrate.  It was difficult to palpate pedal  pulses on the right side.  However, the foot is pink, warm and well-  perfused.  She does have a 1+ left dorsalis pedis pulse.  She continues  to take her Coumadin and states that her levels are being adjusted  intermittently.  She had some pain in her left calf and left groin  recently.  The left groin has no obvious mass.  There is a 2+ pulse,  there is a 2+ left popliteal pulse.  She had bilateral ABIs performed  today which were 1.23 on the left, 1.01 on the right.  She states that  she has returned getting out and going to her church.   Overall she appears to be doing well and improving with each  postoperative visit.  She will follow up with me in one month's time to  recheck her fasciotomy wound.  Hopefully it will be completely healed  over at this point.  She will need repeat ABIs in 6 months.   Janetta Hora. Fields, MD  Electronically Signed   CEF/MEDQ  D:  06/10/2008  T:  06/11/2008  Job:  [id number]

## 2010-06-14 NOTE — Op Note (Signed)
NAME:  Katherine Cuevas, Katherine Cuevas                ACCOUNT NO.:  [id number]   MEDICAL RECORD NO.:  [id number]          PATIENT TYPE:  INP   LOCATION:  9320                          FACILITY:  [facility]   PHYSICIAN:  Charles A. Delcambre, MDDATE OF BIRTH:  10-Jul-1919   DATE OF PROCEDURE:  11/28/2006  DATE OF DISCHARGE:                               OPERATIVE REPORT   PREOPERATIVE DIAGNOSIS:  Ovarian cyst/mass, increasing in size and  followed over the last 4 years, now closing pelvic pain.   POSTOPERATIVE DIAGNOSES:  1. Ovarian cyst/mass, increasing in size and followed over the last 4      years, now closing pelvic pain.  2. Mild pelvic adhesive disease.   PROCEDURES:  1. Transabdominal hysterectomy, bilateral salpingo-oophorectomy.  2. Lysis of adhesions.   SURGEON:  Charles A. Sydnee Cabal, M.D.   ASSISTANT:  Gerald Leitz, M.D.   COMPLICATIONS:  None.   ESTIMATED BLOOD LOSS:  Less than 50 mL.   ANESTHESIA:  General by the endotracheal route.   FINDINGS:  Sigmoid adhesions to the right sidewall and posterior uterus,  mild.  Benign left ovarian cyst, approximately 7-8 cm, sent to pathology  during the case, and pathologist returned and wrote benign findings.  Instrument, sponge, and needle count correct x2.  Uterus, tubes and  ovaries to pathology.   DESCRIPTION OF PROCEDURE:  The patient was taken to the operating room  and placed in the supine position.  General anesthetic was induced  without difficulty.  She was sterilely prepped and draped.  Vertical  skin incision was made with a knife and carried up to the umbilicus.  Mayo scissors were used to extend the fascial incision.  Rectus muscles  were bluntly dissected.  Peritoneum was entered without difficulty with  Metzenbaum scissors, without damage to bowel.  Metzenbaum scissors were  used to extend this incision.  Balfour retractor was placed. Moistened  laps x5 were used to pack the bowel gently out of the pelvis.  Carefully, the uterus was  lifted up.  The uterus was small.  Left ovary  was not ruptured during the case.  Kelly clamps were placed on the  cornual regions.  The round ligaments were opened bilaterally and  transfixion stitched.  Broad ligaments were opened.  The vesicouterine  peritoneum was taken across the lower uterine segment, and bladder was  taken down, exposing the uterine vessels better.  Ureters were seen  clearly on either side.  The  infundibulopelvic pedicles were developed,  free tied, and then transfixion stitched with 0 Vicryl.  Hemostasis was  excellent.  These pedicles were well away from the ureters.  This was  done on the left and then the right side.  The bladder was clear.  Uterine uterine vessels were skeletonized, clamped, and transfixion  stitched with 0 Vicryl.  Good hemostasis was noted.  Straight clamp was  placed on either side.  Pedicles were transfixion stitched, with good  hemostasis.  Final clamp, curved Zeppelin, was placed across the vaginal  fornix below the cervix, and specimen was excised to go to pathology.  As soon as we  had opened the abdomen and opened the broad ligament, the  ovary was excised and went to pathology for evaluation during the case,  with findings as noted above.  0 sutures were used in pop-offs; 2-0  Vicryl was used in pop-offs to close the cuff.  Hemostasis was  excellent.  Irrigation was carried out x2.  Hemostasis was verified to  be excellent.  Cuff hemostasis was good.  Bladder hemostasis was good.  All pedicles were visualized.  Good hemostasis was noted.  Laps were  removed.  Sutures were removed.  Balfour retractor was removed.  #1 PDS  was then used to close the fascia and peritoneum in en bloc fashion.  Irrigation was carried out.  The subcutaneous tissue was of good  hemostasis, and skin was closed, with sterile dressings. taken to  recovery, with physician in attendance.      Charles A. Sydnee Cabal, MD  Electronically Signed     CAD/MEDQ   D:  11/28/2006  T:  11/29/2006  Job:  [id number]

## 2010-06-14 NOTE — Procedures (Signed)
BYPASS GRAFT EVALUATION   INDICATION:  Follow up evaluation of lower extremity arterial study.   HISTORY:  Diabetes:  no  Cardiac:  no  Hypertension:  yes  Smoking:  no  Previous Surgery:  Right popliteal and tibial artery thrombectomy on  03/03/2008 by Dr. Darrick Penna.   SINGLE LEVEL ARTERIAL EXAM                               RIGHT              LEFT  Brachial:                    167                165  Anterior tibial:             164                174  Posterior tibial:            152                183  Peroneal:  Ankle/brachial index:        0.98               1.10   PREVIOUS ABI:  Date: 06/10/2008  RIGHT:  1.01  LEFT:  1.23   LOWER EXTREMITY BYPASS GRAFT DUPLEX EXAM:   DUPLEX:  Biphasic duplex waveform noted within right popliteal,  posterior tibial and peroneal arteries.   IMPRESSION:  1. Patent right popliteal and tibial arteries with no evidence of      focal stenosis.  2. Normal bilateral lower extremity ankle-brachial indices with      biphasic and triphasic Doppler waveforms.   ___________________________________________  Janetta Hora Fields, MD   AC/MEDQ  D:  09/30/2008  T:  10/01/2008  Job:  [id number]

## 2010-06-14 NOTE — Discharge Summary (Signed)
NAMEMIRHA, BRUCATO NO.:  [id number]   MEDICAL RECORD NO.:  [id number]          PATIENT TYPE:  INP   LOCATION:  2027                         FACILITY:  [facility]   PHYSICIAN:  Janetta Hora. Fields, MD  DATE OF BIRTH:  01/31/1919   DATE OF ADMISSION:  03/02/2008  DATE OF DISCHARGE:                               DISCHARGE SUMMARY   DISCHARGE DIAGNOSES:  1. Acute right lower extremity ischemia.  2. Hyperthyroidism.  3. History of pulmonary emboli status post veno caval ligation.  4. Gout.  5. Gastroesophageal reflux disease.  6. Hypertension.  7. Lumbar stenosis.  8. Chronic renal insufficiency.  9. Fibromyalgia.   PROCEDURE PERFORMED:  1. Thrombectomy of right popliteal and tibial arteries with vein patch      angioplasty of right tibioperoneal peroneal trunk with intra-      arterial thrombolysis and intraoperative arteriogram x2 March 11, 2008, by Dr. Darrick Penna.  2. Aortogram with right lower extremity runoff March 03, 2008, by      Dr. Darrick Penna.  3. Embolectomy of right superficial femoral artery with embolectomy of      right anterior tibial artery and tibial peroneal trunk of right leg      with 4-compartment fasciotomy and intraoperative arteriogram x1 by      Dr. Darrick Penna March 02, 2008.   COMPLICATIONS:  None.   CONDITION ON DISCHARGE:  Stable, improving.   DISCHARGE MEDICATIONS:  1. Allopurinol 100 mg p.o. daily.  2. Norvasc 5 mg p.o. daily.  3. Prilosec OTC 1 tablet p.o. daily.  4. Synthroid 50 mcg p.o. daily.  5. Torsemide 20 mg p.o. b.i.d.  6. Tramadol 50 mg 1 to 2 tablets p.o. b.i.d.  7. Coumadin take as directed.  We will depend upon the physician at      her skilled nursing facility to manage her Coumadin.  Her INR      should be 2-2.5.   DISPOSITION:  She is being discharged home in stable condition with her  wounds healing well.  She does have wound VAC in place.  She is given  careful instructions regarding the care of her wounds  and her activity  level.  She is to see Dr. Darrick Penna in 2 weeks with ABIs.  The appointments  will be scheduled for her.   BRIEF IDENTIFYING STATEMENT:  For complete details, please refer the  typed history and physical.  Briefly, this very pleasant woman presented  to the emergency department with an acutely ischemic right lower  extremity.  Dr. Darrick Penna evaluated her and recommended emergency  revascularization.  She was informed of the risks and benefits of the  procedure, and after careful consideration, he elected to proceed with  surgery.   HOSPITAL COURSE:  Preoperative workup was completed on an emergency  basis.  She was taken to the operating room following an aortogram and  underwent the aforementioned revascularization procedure.  She tolerated  the procedure and was returned to the post-anesthesia care unit  extubated.  Following stabilization, she was transferred to a bed on the  surgical  step-down unit.  The following morning, she had lost pulses in  her leg and required emergency return to the operating room where she  again underwent a revascularization procedure.  For complete details,  please refer to the operative report.  The procedure was without  complications, and she was again returned to the surgical step-down  unit.   She was started on anticoagulation with heparin and bridge to Coumadin.  Over the next several days, her wounds were healing.  Wound VAC were  placed in the fasciotomies on the lateral and medial surface of her  right leg.  She was able to weight bear somewhat on the leg.  She  continued to have motion and movement in her toes and ankles which  improved over the course of the hospital stay.   Due to her age and need for further care and for rehabilitative  purposes, we felt that she would be a good candidate for a short stay in  a skilled nursing facility.  We have requested several facilities to  evaluate her, and when she is accepted, she will  be discharged to that  facility. DIAGNOSES/>      Wilmon Arms, PA      Janetta Hora. Fields, MD  Electronically Signed    KEL/MEDQ  D:  03/14/2008  T:  03/14/2008  Job:  [id number]   cc:   Janetta Hora. Darrick Penna, MD

## 2010-06-14 NOTE — Procedures (Signed)
BYPASS GRAFT EVALUATION   INDICATION:  Followup of right lower extremity thrombectomies in  popliteal and tibial arteries.   HISTORY:  Diabetes:  No.  Cardiac:  No.  Hypertension:  Yes.  Smoking:  No.  Previous Surgery:  Right popliteal and tibial artery thrombectomies  03/03/2008 by Dr. Darrick Penna.   SINGLE LEVEL ARTERIAL EXAM                               RIGHT              LEFT  Brachial:                    156                155  Anterior tibial:             149                160  Posterior tibial:            114                177  Peroneal:  Ankle/brachial index:        0.96               1.13   PREVIOUS ABI:  Date: 09/30/2008  RIGHT:  0.98  LEFT:  1.10   LOWER EXTREMITY BYPASS GRAFT DUPLEX EXAM:   DUPLEX:  Triphasic Doppler waveforms throughout the common femoral  artery, SFA, pop, ATA and peroneal arteries.  Becomes monophasic in the  posterior artery on the right.   IMPRESSION:  Patent right lower extremity arteries with no evidence of  stenosis.  Ankle brachial indices within normal limits.   ___________________________________________  Katherine Hora Fields, MD   CJ/MEDQ  D:  03/24/2009  T:  03/24/2009  Job:  [id number]

## 2010-06-17 NOTE — H&P (Signed)
NAME:  Katherine Cuevas, Katherine Cuevas                ACCOUNT NO.:  [id number]   MEDICAL RECORD NO.:  [id number]          PATIENT TYPE:  INP   LOCATION:  0104                         FACILITY:  [facility]   PHYSICIAN:  Corinna L. Lendell Caprice, MDDATE OF BIRTH:  Oct 10, 1919   DATE OF ADMISSION:  09/25/2005  DATE OF DISCHARGE:                                HISTORY & PHYSICAL   CHIEF COMPLAINT:  Fall.   HISTORY OF PRESENT ILLNESS:  Ms. Katherine Cuevas is a pleasant 77 year old white  female who presents to the emergency room after having slipped and fell  while transporting some potting soil.  She hit her head and also is  complaining of shoulder, hand, and arm pain on the right. She did not lose  consciousness.  She reports she also had some angina from the hot weather  today.  She has no chest pain currently.  The chest pain only lasted about a  minute.  She suffers from angina about once a week, and this has not  changed.  She had a CT of her head and the x-ray of her hand.  She had a  large scalp laceration which was sutured by the ED physician's assistant.  She received morphine and started vomiting continuously and is felt to need  admission due to these issues.  She lives alone.   PAST MEDICAL HISTORY:  1. Coronary artery disease.  2. History of MI.  3. History of thyroid resection x2.  4. 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  5. Back surgery.  6. Laparoscopic cholecystectomy.  7. Appendectomy.  8. Hyperlipidemia.  9. Hypertension.  10.Gastroesophageal reflux disease.   She reports an intolerance to CODEINE and MORPHINE which cause vomiting and  rash with DILAUDID.   SOCIAL HISTORY:  She is widowed and lives alone.  She is a retired Engineer, civil (consulting).  She has no drinking or smoking history.   FAMILY HISTORY:  Noncontributory.   REVIEW OF SYSTEMS:  As above, otherwise negative.   PHYSICAL EXAMINATION:  VITAL SIGNS: Temperature 96.9, blood pressure 138/82,  pulse  83, respiratory rate 20, oxygen saturation 98% on room air.  GENERAL: The patient appears uncomfortable and has emesis basin close at  hand.  She has a wet rag on her head.  HEENT: She has a bandage over her scalp.  Pupils equal, round, and reactive  to light.  She has an abrasion over her right upper lip.  She has a  contusion to her right facial area.  NECK: Supple.  No lymphadenopathy.  LUNGS: Clear to auscultation bilaterally without wheeze, rhonchi, or rales.  CARDIOVASCULAR:  Regular rate and rhythm without murmurs, gallops, or rubs.  ABDOMEN:  Normal bowel sounds.  Soft, nontender, nondistended.  GU/RECTAL:  Deferred.  EXTREMITIES: She has Kerlix and a splint over her right wrist.  She has  decreased range of motion of her right shoulder due to pain.  There is some  mild tenderness there.  PSYCHIATRIC: Normal affect.  NEUROLOGIC:  Alert and oriented. Cranial nerves, sensory, and motor exams  are  intact.   LABORATORY DATA:  CBC is unremarkable.  PT, PTT unremarkable.  Basic  metabolic panel significant for a BUN of 27, creatinine 1.6.  Myoglobin 305.   EKG shows normal sinus rhythm, left axis deviation, incomplete right bundle  branch block, age-indeterminate inferior infarct.   CT of the head shows a right frontal scalp laceration.  No fracture or brain  injury.   CT of the C spine shows no fracture.  Nodularity of the right lobe of the  thyroid status post resection of the left lobe.  Moderate cervical  spondylosis.   X-ray of the right hand shows osteoarthritis, no fracture.   ASSESSMENT AND PLAN:  1. Status post fall with resulting scalp laceration and abrasion to the      face as well as reported hematoma to the right forearm with subsequent      vomiting.  The patient did not lose consciousness.  She reports she was      slightly nauseated prior to coming to the emergency room, but since the      MORPHINE has had intractable vomiting and dry heaves.  I will place  her      on observation, give small doses of Demerol as she is intolerant and/or      allergic to both MORPHINE and DILAUDID.  She will get IV fluids,      antiemetics.  Will get physical therapy and occupational therapy as she      may have some need for a walker, etc.  Also, she lives alone, and she      may need short-term placement.  Her daughter, however, may be able to      stay with her this coming up week.  2. Coronary artery disease.  3. Hyperlipidemia.  4. Hypertension.  5. Hypothyroidism.      Corinna L. Lendell Caprice, MD  Electronically Signed     CLS/MEDQ  D:  09/25/2005  T:  09/25/2005  Job:  [id number]   cc:   C. Duane Lope, M.D.  Fax: [phone]

## 2010-06-17 NOTE — Discharge Summary (Signed)
NAME:  Katherine Cuevas, Katherine Cuevas                ACCOUNT NO.:  [id number]   MEDICAL RECORD NO.:  [id number]          PATIENT TYPE:  INP   LOCATION:  9320                          FACILITY:  [facility]   PHYSICIAN:  Charles A. Delcambre, MDDATE OF BIRTH:  Nov 05, 1919   DATE OF ADMISSION:  11/28/2006  DATE OF DISCHARGE:  12/01/2006                               DISCHARGE SUMMARY   DISCHARGE DIAGNOSES:  1. Pathology is not on the chart, but as I recall serous cystadenoma,      benign of the ovary.  2. Pelvic adhesive disease.   PROCEDURE:  Transabdominal hysterectomy, bilateral salpingo-oophorectomy  and lysis of adhesions.   HISTORY AND PHYSICAL:  Dictated and on chart.   DISPOSITION:  The patient discharged home to follow-up in the office in  72 hours to discontinue staples.  She was given Darvocet N-100 #30 and  was given convalescent instructions to rest at home.  No driving for 2  weeks.  No lifting greater than 20 pounds for 4 weeks.  Call for any  temperature greater than 100 degrees, increased vaginal bleeding or  abdominal pain, or inability for oral intake.   LABORATORY:  Postoperative hematocrit 35.3, hemoglobin 12, BUN 28,  creatinine 1.5.   FOLLOW UP:  She will follow-up with medical doctor, internal medicine.   SPECIMEN:  Uterus, tubes and ovaries to pathology.  Benign on frozen  section.   HOSPITAL COURSE:  The patient was admitted, underwent surgery as noted  above.  Postoperatively, she had routine postoperative course.  Foley  catheter was discontinued on postoperative day #1.  She voided without  difficulty.  She had good urine output.  Laboratories returned as noted  above.  She was started back on home medications on postoperative day  #1.  Continues to do well.  On postoperative day #2 moderate pain and  ambulatory issues secondary to age of 75, and was therefore discharged  on postoperative day #3 when she was ambulating better with follow-up as  noted above.      Charles A. Sydnee Cabal, MD  Electronically Signed     CAD/MEDQ  D:  12/27/2006  T:  12/27/2006  Job:  [id number]

## 2010-06-18 IMAGING — CR DG CHEST 1V PORT
1 series · 1 of 1 positions shown · non-contrast
Comparison: None

CLINICAL DATA: Dyspnea.

PORTABLE CHEST - 1 VIEW

[view not recorded]
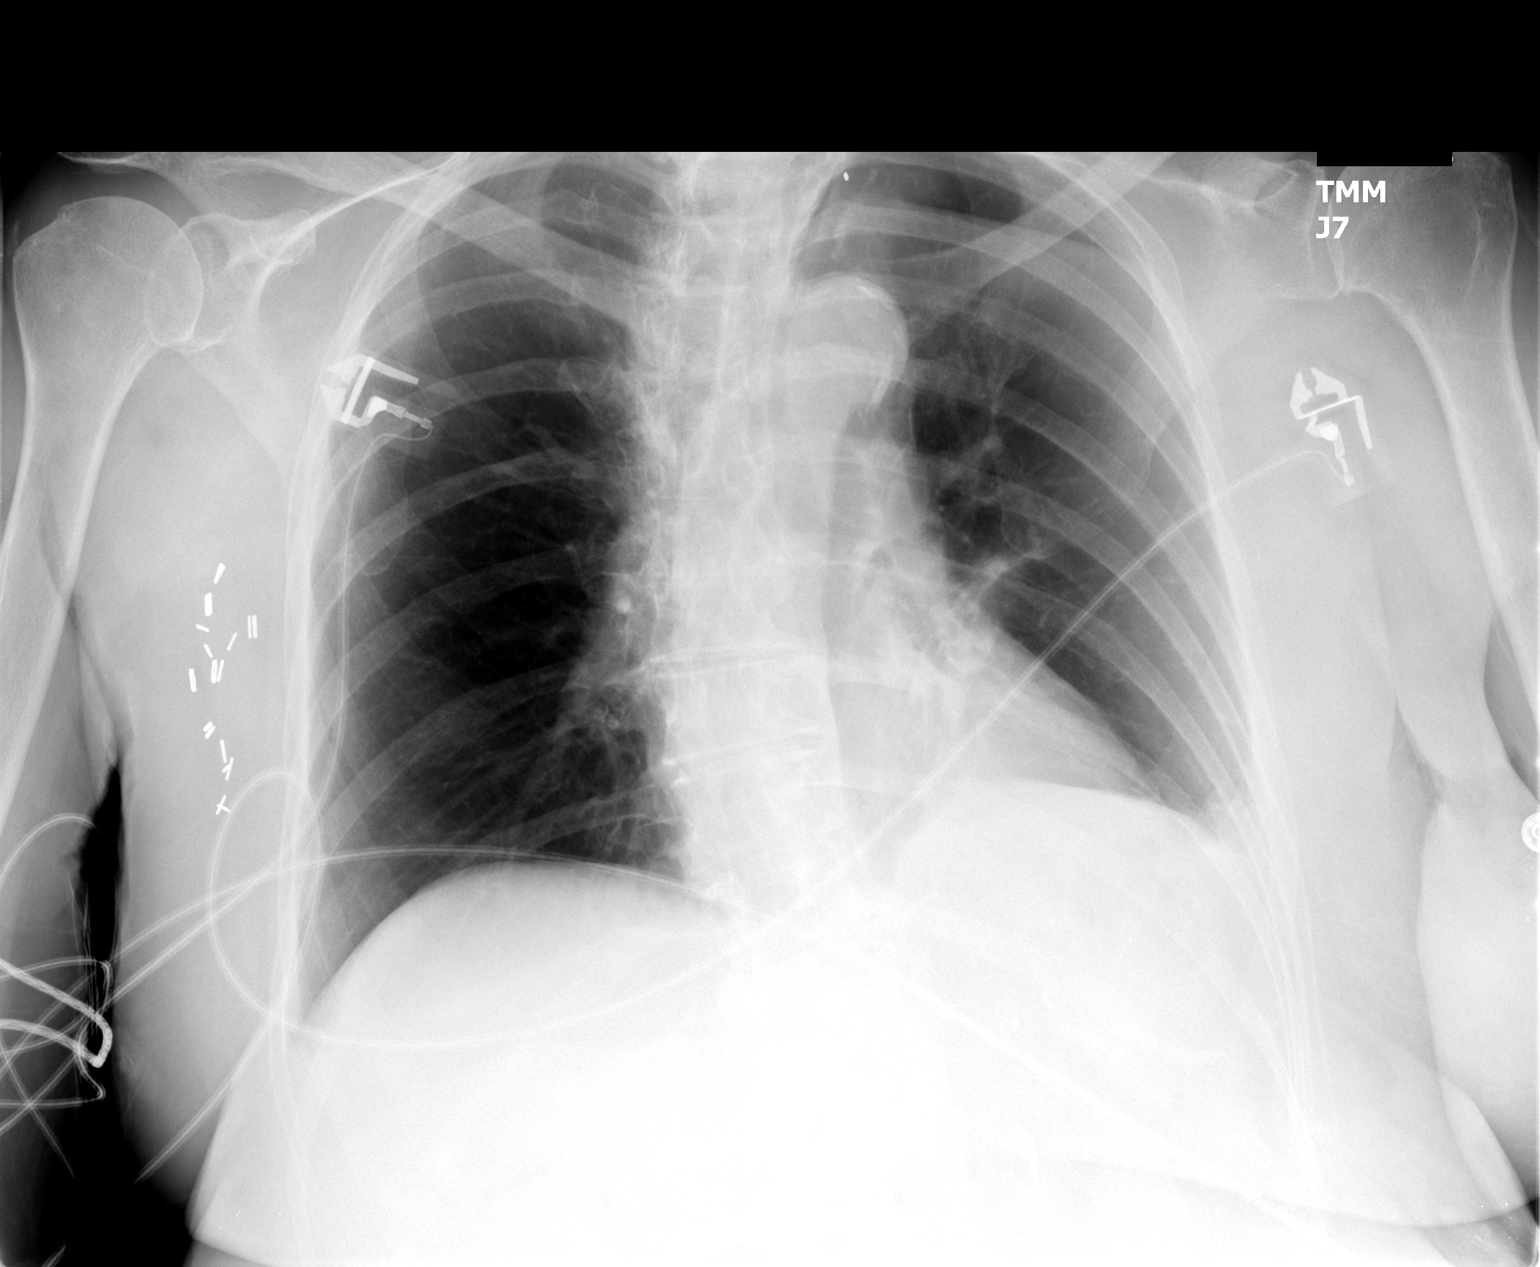

[1 of 1 positions shown; findings below may reference images not displayed]

FINDINGS: Left basilar atelectasis/scarring is noted.
No definite focal airspace disease, pleural effusions, or
pneumothorax noted.
The cardiomediastinal silhouette is unremarkable.
No acute bony abnormalities are identified.
Surgical clips overlying the lower left neck and right axillary
region/breast are noted.
IMPRESSION: Left basilar atelectasis/scarring.

No evidence of focal airspace disease to suggest pneumonia.

REF:G5 DICTATED: 03/04/2008 [DATE]

## 2010-11-09 LAB — BASIC METABOLIC PANEL
BUN: 28 — ABNORMAL HIGH
CO2: 25
Chloride: 103
Creatinine, Ser: 1.51 — ABNORMAL HIGH
GFR calc Af Amer: 40 — ABNORMAL LOW
Glucose, Bld: 102 — ABNORMAL HIGH

## 2010-11-09 LAB — CBC
HCT: 35.3 — ABNORMAL LOW
Hemoglobin: 14
MCHC: 33.7
MCHC: 34
MCV: 90.8
Platelets: 273
Platelets: 358
RDW: 14.8 — ABNORMAL HIGH
WBC: 8.4

## 2010-11-09 LAB — TYPE AND SCREEN: ABO/RH(D): A NEG

## 2010-11-10 LAB — CBC
HCT: 42.4
MCV: 89.9
Platelets: 362
RDW: 15.1 — ABNORMAL HIGH

## 2011-02-07 ENCOUNTER — Emergency Department (HOSPITAL_COMMUNITY): Payer: Medicare Other

## 2011-02-07 ENCOUNTER — Encounter: Payer: Self-pay | Admitting: Emergency Medicine

## 2011-02-07 ENCOUNTER — Inpatient Hospital Stay (HOSPITAL_COMMUNITY)
Admission: EM | Admit: 2011-02-07 | Discharge: 2011-02-09 | DRG: 392 | Disposition: A | Discharge status: Home / Self Care | Payer: Medicare Other | Source: Ambulatory Visit | Attending: Family Medicine | Admitting: Family Medicine

## 2011-02-07 DIAGNOSIS — M109 Gout, unspecified: Secondary | ICD-10-CM | POA: Diagnosis present

## 2011-02-07 DIAGNOSIS — Z79899 Other long term (current) drug therapy: Secondary | ICD-10-CM

## 2011-02-07 DIAGNOSIS — R1011 Right upper quadrant pain: Secondary | ICD-10-CM | POA: Diagnosis present

## 2011-02-07 DIAGNOSIS — I2510 Atherosclerotic heart disease of native coronary artery without angina pectoris: Secondary | ICD-10-CM | POA: Diagnosis present

## 2011-02-07 DIAGNOSIS — I739 Peripheral vascular disease, unspecified: Secondary | ICD-10-CM | POA: Diagnosis present

## 2011-02-07 DIAGNOSIS — E039 Hypothyroidism, unspecified: Secondary | ICD-10-CM | POA: Diagnosis present

## 2011-02-07 DIAGNOSIS — R112 Nausea with vomiting, unspecified: Secondary | ICD-10-CM | POA: Diagnosis present

## 2011-02-07 DIAGNOSIS — N189 Chronic kidney disease, unspecified: Secondary | ICD-10-CM | POA: Insufficient documentation

## 2011-02-07 DIAGNOSIS — IMO0001 Reserved for inherently not codable concepts without codable children: Secondary | ICD-10-CM | POA: Diagnosis present

## 2011-02-07 DIAGNOSIS — K5900 Constipation, unspecified: Secondary | ICD-10-CM | POA: Insufficient documentation

## 2011-02-07 DIAGNOSIS — M48061 Spinal stenosis, lumbar region without neurogenic claudication: Secondary | ICD-10-CM | POA: Diagnosis present

## 2011-02-07 DIAGNOSIS — N183 Chronic kidney disease, stage 3 unspecified: Secondary | ICD-10-CM | POA: Diagnosis present

## 2011-02-07 DIAGNOSIS — Z7901 Long term (current) use of anticoagulants: Secondary | ICD-10-CM

## 2011-02-07 DIAGNOSIS — K5730 Diverticulosis of large intestine without perforation or abscess without bleeding: Principal | ICD-10-CM | POA: Diagnosis present

## 2011-02-07 DIAGNOSIS — Z86711 Personal history of pulmonary embolism: Secondary | ICD-10-CM

## 2011-02-07 DIAGNOSIS — E785 Hyperlipidemia, unspecified: Secondary | ICD-10-CM | POA: Diagnosis present

## 2011-02-07 DIAGNOSIS — E7800 Pure hypercholesterolemia, unspecified: Secondary | ICD-10-CM | POA: Insufficient documentation

## 2011-02-07 DIAGNOSIS — R110 Nausea: Secondary | ICD-10-CM | POA: Diagnosis present

## 2011-02-07 DIAGNOSIS — E119 Type 2 diabetes mellitus without complications: Secondary | ICD-10-CM | POA: Diagnosis present

## 2011-02-07 DIAGNOSIS — I10 Essential (primary) hypertension: Secondary | ICD-10-CM | POA: Diagnosis present

## 2011-02-07 DIAGNOSIS — D649 Anemia, unspecified: Secondary | ICD-10-CM | POA: Insufficient documentation

## 2011-02-07 DIAGNOSIS — R350 Frequency of micturition: Secondary | ICD-10-CM | POA: Diagnosis present

## 2011-02-07 DIAGNOSIS — I252 Old myocardial infarction: Secondary | ICD-10-CM

## 2011-02-07 DIAGNOSIS — Z886 Allergy status to analgesic agent status: Secondary | ICD-10-CM

## 2011-02-07 DIAGNOSIS — I129 Hypertensive chronic kidney disease with stage 1 through stage 4 chronic kidney disease, or unspecified chronic kidney disease: Secondary | ICD-10-CM | POA: Diagnosis present

## 2011-02-07 DIAGNOSIS — M129 Arthropathy, unspecified: Secondary | ICD-10-CM | POA: Diagnosis present

## 2011-02-07 DIAGNOSIS — Z888 Allergy status to other drugs, medicaments and biological substances status: Secondary | ICD-10-CM

## 2011-02-07 HISTORY — DX: Chronic kidney disease, unspecified: N18.9

## 2011-02-07 HISTORY — DX: Atherosclerotic heart disease of native coronary artery without angina pectoris: I25.10

## 2011-02-07 HISTORY — DX: Other pulmonary embolism without acute cor pulmonale: I26.99

## 2011-02-07 HISTORY — DX: Spinal stenosis, lumbar region without neurogenic claudication: M48.061

## 2011-02-07 HISTORY — DX: Pure hypercholesterolemia, unspecified: E78.00

## 2011-02-07 HISTORY — DX: Disorder of arteries and arterioles, unspecified: I77.9

## 2011-02-07 HISTORY — DX: Hypothyroidism, unspecified: E03.9

## 2011-02-07 HISTORY — DX: Fibromyalgia: M79.7

## 2011-02-07 HISTORY — DX: Essential (primary) hypertension: I10

## 2011-02-07 HISTORY — DX: Unspecified osteoarthritis, unspecified site: M19.90

## 2011-02-07 HISTORY — DX: Anemia, unspecified: D64.9

## 2011-02-07 HISTORY — DX: Disorder of thyroid, unspecified: E07.9

## 2011-02-07 LAB — URINALYSIS, ROUTINE W REFLEX MICROSCOPIC
Glucose, UA: NEGATIVE mg/dL
Protein, ur: NEGATIVE mg/dL
Specific Gravity, Urine: 1.01 (ref 1.005–1.030)
pH: 7 (ref 5.0–8.0)

## 2011-02-07 LAB — COMPREHENSIVE METABOLIC PANEL
ALT: 12 U/L (ref 0–35)
AST: 25 U/L (ref 0–37)
Albumin: 3.9 g/dL (ref 3.5–5.2)
Alkaline Phosphatase: 61 U/L (ref 39–117)
Chloride: 102 mEq/L (ref 96–112)
Potassium: 4.7 mEq/L (ref 3.5–5.1)
Total Bilirubin: 0.4 mg/dL (ref 0.3–1.2)

## 2011-02-07 LAB — CBC
Hemoglobin: 13.5 g/dL (ref 12.0–15.0)
MCHC: 34.6 g/dL (ref 30.0–36.0)
RDW: 13.5 % (ref 11.5–15.5)
WBC: 8.2 10*3/uL (ref 4.0–10.5)

## 2011-02-07 LAB — DIFFERENTIAL
Basophils Absolute: 0.1 10*3/uL (ref 0.0–0.1)
Basophils Relative: 1 % (ref 0–1)
Neutro Abs: 5.2 10*3/uL (ref 1.7–7.7)
Neutrophils Relative %: 64 % (ref 43–77)

## 2011-02-07 LAB — PROTIME-INR: INR: 1.41 (ref 0.00–1.49)

## 2011-02-07 MED ORDER — ACETAMINOPHEN 325 MG PO TABS
650.0000 mg | ORAL_TABLET | Freq: Four times a day (QID) | ORAL | Status: DC | PRN
  Administered 2011-02-08: 650 mg via ORAL
  Filled 2011-02-07: qty 2

## 2011-02-07 MED ORDER — MORPHINE SULFATE 2 MG/ML IJ SOLN
2.0000 mg | Freq: Once | INTRAMUSCULAR | Status: AC
  Administered 2011-02-07: 2 mg via INTRAVENOUS
  Filled 2011-02-07: qty 1

## 2011-02-07 MED ORDER — SODIUM CHLORIDE 0.9 % IV BOLUS (SEPSIS)
500.0000 mL | Freq: Once | INTRAVENOUS | Status: AC
  Administered 2011-02-07: 500 mL via INTRAVENOUS

## 2011-02-07 MED ORDER — ONDANSETRON HCL 4 MG/2ML IJ SOLN
4.0000 mg | Freq: Once | INTRAMUSCULAR | Status: AC
  Administered 2011-02-07: 4 mg via INTRAVENOUS
  Filled 2011-02-07: qty 2

## 2011-02-07 MED ORDER — SODIUM CHLORIDE 0.9 % IV SOLN
250.0000 mL | INTRAVENOUS | Status: DC | PRN
  Administered 2011-02-08: 20 mL via INTRAVENOUS

## 2011-02-07 MED ORDER — SODIUM CHLORIDE 0.45 % IV SOLN
INTRAVENOUS | Status: DC
  Administered 2011-02-07: 22:00:00 via INTRAVENOUS

## 2011-02-07 MED ORDER — SODIUM CHLORIDE 0.9 % IV BOLUS (SEPSIS): 1000.0000 mL | Freq: Once | INTRAVENOUS | Status: DC

## 2011-02-07 MED ORDER — SODIUM CHLORIDE 0.9 % IJ SOLN
3.0000 mL | Freq: Two times a day (BID) | INTRAMUSCULAR | Status: DC
  Administered 2011-02-08: 3 mL via INTRAVENOUS

## 2011-02-07 MED ORDER — HEPARIN SOD (PORCINE) IN D5W 100 UNIT/ML IV SOLN
900.0000 [IU]/h | INTRAVENOUS | Status: DC
  Administered 2011-02-08 – 2011-02-09 (×2): 900 [IU]/h via INTRAVENOUS
  Filled 2011-02-07 (×3): qty 250

## 2011-02-07 MED ORDER — SODIUM CHLORIDE 0.9 % IJ SOLN: 3.0000 mL | INTRAMUSCULAR | Status: DC | PRN

## 2011-02-07 MED ORDER — LEVOTHYROXINE SODIUM 50 MCG PO TABS
50.0000 ug | ORAL_TABLET | Freq: Every day | ORAL | Status: DC
  Administered 2011-02-08 – 2011-02-09 (×2): 50 ug via ORAL
  Filled 2011-02-07 (×2): qty 1

## 2011-02-07 MED ORDER — MORPHINE SULFATE 2 MG/ML IJ SOLN
1.0000 mg | INTRAMUSCULAR | Status: DC | PRN
  Administered 2011-02-07 – 2011-02-08 (×2): 1 mg via INTRAVENOUS
  Filled 2011-02-07 (×2): qty 1

## 2011-02-07 MED ORDER — AMLODIPINE BESYLATE 5 MG PO TABS
5.0000 mg | ORAL_TABLET | Freq: Every day | ORAL | Status: DC
  Administered 2011-02-08 – 2011-02-09 (×2): 5 mg via ORAL
  Filled 2011-02-07 (×2): qty 1

## 2011-02-07 MED ORDER — IOHEXOL 300 MG/ML  SOLN
80.0000 mL | Freq: Once | INTRAMUSCULAR | Status: AC | PRN
  Administered 2011-02-07: 80 mL via INTRAVENOUS

## 2011-02-07 MED ORDER — ALLOPURINOL 100 MG PO TABS
100.0000 mg | ORAL_TABLET | Freq: Every day | ORAL | Status: DC
  Administered 2011-02-08 – 2011-02-09 (×2): 100 mg via ORAL
  Filled 2011-02-07 (×2): qty 1

## 2011-02-07 MED ORDER — FUROSEMIDE 40 MG PO TABS
40.0000 mg | ORAL_TABLET | Freq: Every day | ORAL | Status: DC
  Administered 2011-02-08 – 2011-02-09 (×2): 40 mg via ORAL
  Filled 2011-02-07 (×2): qty 1

## 2011-02-07 MED ORDER — LOSARTAN POTASSIUM 50 MG PO TABS
100.0000 mg | ORAL_TABLET | Freq: Every day | ORAL | Status: DC
  Administered 2011-02-08 – 2011-02-09 (×2): 100 mg via ORAL
  Filled 2011-02-07 (×2): qty 2

## 2011-02-07 MED ORDER — ACETAMINOPHEN 650 MG RE SUPP: 650.0000 mg | Freq: Four times a day (QID) | RECTAL | Status: DC | PRN

## 2011-02-07 NOTE — ED Notes (Signed)
Pt. Transferred to floor via stretcher with tech 

## 2011-02-07 NOTE — H&P (Signed)
PATIENT DETAILS Name: Katherine Cuevas Age: 78 y.o. Sex: female Date of Birth: 1919/06/22 Admit Date: 02/07/2011 [id number] Hessie Diener, MD, MD   CHIEF COMPLAINT:  Abdominal pain  HPI: 78 year old female with an extensive history of peripheral vascular disease, presenting with severe right upper quadrant abdominal pain associated to nausea with occasional vomiting for the last 4-5 days. Patient denies dysuria, frequency or hematuria and in fact she states she has not urinated much since this morning. She denies diarrhea, constipation, hematochezia or melena. She had a normal bowel movement this morning. She denies fever or chills. The pain is described as sharp, stabbing, 9-10 out of 10 in severity, intermittent, with some radiation to right flank, worse by nothing and improved by 2 doses of morphine in the emergency department. The patient has a history of appendectomy and cholecystectomy many years ago. In the emergency department she underwent a comprehensive workup that included a CT scan of the abdomen and pelvis with contrast and she was just found with diverticulosis without diverticulitis. No evidence of ischemic bowel or bowel obstruction.   ALLERGIES:   Allergies  Allergen Reactions  . Celebrex (Celecoxib) Diarrhea  . Codeine Nausea Only  . Morphine And Related Nausea Only  . Dilaudid (Hydromorphone Hcl) Rash    PAST MEDICAL HISTORY: Past Medical History  Diagnosis Date  . Gout   . Hypercholesterolemia   . Diabetes mellitus   . Hypertension   . Thyroid disease   . Anemia   . Arthritis   . Constipation   . Chronic kidney disease   . Pulmonary embolism 1969  . Peripheral arterial occlusive disease     s/p thrombectomy right popliteal and tibial arteries and  embolectomy right superficial femoral artery  . Gout   . Lumbar stenosis   . CAD (coronary artery disease) 1972    s/p SEMI  . Fibromyalgia   . Hypothyroidism     PAST SURGICAL HISTORY: Past Surgical History    Procedure Date  . Vena cava umbrella placement secondary to pulmonary embolism 1969 1969  . Tubal ligation   . Appendectomy   . Tonsillectomy   Cholecystectomy  MEDICATIONS AT HOME: Prior to Admission medications   Medication Sig Start Date End Date Taking? Authorizing Provider  allopurinol (ZYLOPRIM) 100 MG tablet Take 100 mg by mouth daily.     Yes Historical Provider, MD  amLODipine (NORVASC) 5 MG tablet Take 5 mg by mouth daily.     Yes Historical Provider, MD  cholecalciferol (VITAMIN D) 1000 UNITS tablet Take 1,000 Units by mouth daily.     Yes Historical Provider, MD  furosemide (LASIX) 20 MG tablet Take 40 mg by mouth daily.     Yes Historical Provider, MD  levothyroxine (SYNTHROID, LEVOTHROID) 50 MCG tablet Take 50 mcg by mouth daily.     Yes Historical Provider, MD  losartan (COZAAR) 100 MG tablet Take 100 mg by mouth daily.     Yes Historical Provider, MD  Multiple Vitamin (MULITIVITAMIN WITH MINERALS) TABS Take 1 tablet by mouth daily.     Yes Historical Provider, MD  nitroGLYCERIN (NITROSTAT) 0.4 MG SL tablet Place 0.4 mg under the tongue every 5 (five) minutes as needed. For chest pains    Yes Historical Provider, MD  ondansetron (ZOFRAN) 4 MG tablet Take 4 mg by mouth every 8 (eight) hours as needed. For nausea    Yes Historical Provider, MD  traMADol (ULTRAM) 50 MG tablet Take 100 mg by mouth every 6 (six) hours as needed.  For pain    Yes Historical Provider, MD  warfarin (COUMADIN) 4 MG tablet Take 2-4 mg by mouth daily. 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 Lives at home with her daughter. Son flew from [place] to celebrate her 90th birthday this Monday  REVIEW OF SYSTEMS:  Constitutional:   No  weight loss, night sweats,  Fevers, chills, fatigue.  Cardio-vascular: No chest pain,   Orthopnea, PND, swelling in lower extremities, dizziness, palpitations  GI:  See history of present illness  Resp: No shortness of breath with exertion or at rest.  No excess mucus, no productive cough, No non-productive cough,  No coughing up of blood.No change in color of mucus.No wheezing.  Skin:  no rash or lesions.  GU:  no dysuria, change in color of urine, no urgency or frequency. Flank pain on the right  Musculoskeletal: No joint pain or swelling.  No decreased range of motion.  No back pain.    PHYSICAL EXAM: Blood pressure 128/66, pulse 82, temperature 98.3 F (36.8 C), temperature source Oral, resp. rate 16, SpO2 94.00%.  General appearance :Awake, alert, not in any distress. Speech Clear. Not toxic Looking HEENT: Atraumatic and Normocephalic, pupils equally reactive to light and accomodation Neck: supple, no JVD. No cervical lymphadenopathy.  Chest:Good air entry bilaterally, no added sounds  CVS: S1 S2 regular, no murmurs.  Abdomen: Bowel sounds present, not distended, no bruits, mildly tender on the right upper quadrant, no guarding or rebounding tenderness, no hepatosplenomegaly or masses palpable Extremities: B/L Lower Ext shows no edema, both legs are warm to touch, with  dorsalis pedis pulses present but  diminished. Surgical scars noted on the right lower extremity Neurology: Awake alert, and oriented X 3, CN II-XII intact, Non focal, Deep Tendon Reflex-2+ all over, plantar's downgoing B/L, sensory exam is grossly intact.  Skin:No Rash Wounds:N/A  LABS ON ADMISSION:   Basename 02/07/11 1614  NA 138  K 4.7  CL 102  CO2 25  GLUCOSE 102*  BUN 29*  CREATININE 1.32*  CALCIUM 10.0  MG --  PHOS --    Basename 02/07/11 1614  AST 25  ALT 12  ALKPHOS 61  BILITOT 0.4  PROT 7.6  ALBUMIN 3.9    Basename 02/07/11 1614  LIPASE 53  AMYLASE --    Basename 02/07/11 1614  WBC 8.2  NEUTROABS 5.2  HGB 13.5  HCT 39.0  MCV 89.2  PLT 347   No results  found for this basename: CKTOTAL:3,CKMB:3,CKMBINDEX:3,TROPONINI:3 in the last 72 hours No results found for this basename: DDIMER:2 in the last 72 hours No components found with this basename: POCBNP:3   RADIOLOGIC STUDIES ON ADMISSION: Dg Chest 2 View  02/07/2011  *RADIOLOGY REPORT*  Clinical Data: Abdominal pain.  CHEST - 2 VIEW  Comparison: 03/04/2008.  Findings: Right axillary dissection clips.  Cholecystectomy clips. Surgical clips are present at the left thoracic inlet.  Stable rightward tracheal deviation at the level of the aortic arch. There is no airspace disease or effusion.  Bosselation of the right hemidiaphragm is present.  Cardiopericardial silhouette appears within normal limits.  Aortic arch atherosclerosis.  IMPRESSION: No acute cardiopulmonary disease.  Postoperative changes cholecystectomy and right axillary dissection.  Original Report Authenticated By: Andreas Newport, M.D.   Ct Abdomen Pelvis W Contrast  02/07/2011  *  RADIOLOGY REPORT*  Clinical Data: Abdominal pain, vomiting  CT ABDOMEN AND PELVIS WITH CONTRAST  Technique:  Multidetector CT imaging of the abdomen and pelvis was performed following the standard protocol during bolus administration of intravenous contrast.  Contrast: 80mL OMNIPAQUE IOHEXOL 300 MG/ML IV SOLN  Comparison: CT abdomen 08/09/2006 the  Findings: Lung bases are clear. There is a focus of atelectasis at the inferior lingula which appears benign.  No pericardial fluid.  No new focal hepatic lesion.  Prior cholecystectomy.  The common bile duct is mildly dilated likely related to the cholecystectomy. This measures 9 mm and is increased from CT 2008.  There is no evidence of pancreatic ductal dilatation.  The spleen, adrenal glands, and kidneys are normal.  The stomach is normal.  Small hiatal hernia is present.  The small bowel and cecum are normal.  Appendix is not identified.  No pericecal inflammation. There are diverticula of the sigmoid colon without acute  inflammation.  Abdominal aorta is normal caliber.  No retroperitoneal lymphadenopathy.  No periportal adenopathy.  No free fluid the pelvis.  The bladder is normal.  Post hysterectomy anatomy.  No adnexal abnormality.  No pelvic lymphadenopathy. Review of  bone windows demonstrates no aggressive osseous lesions.  IMPRESSION:  1.  No acute abdominal or pelvic process. 2.  Cholecystectomy. 3.  Sigmoid diverticulosis without diverticulitis. 4.  Dilatation of the common bile duct is likely related the patient's age and prior cholecystectomy.  Original Report Authenticated By: Genevive Bi, M.D.    ASSESSMENT AND PLAN: Present on Admission:  .Abdominal pain, acute, right upper quadrant. Severe  .Nausea .HTN (hypertension)  Etiology of patient's abdominal pain is not clear at this time. Blood work and CT scan of the abdomen and pelvis with contrast unrevealing. Case discussed with vascular surgeon Dr. Edilia Bo who did not feel that this was a vascular phenomena. We'll admit patient to the hospital for further monitoring and pain management. Repeat CBC and CMP in a.m. consider general surgery consultation if pain persists. Clear liquid diet and n.p.o. after midnight for the time being. Continue morphine when necessary.     Further plan will depend as patient's clinical course evolves and further radiologic and laboratory data become available. Patient will be monitored closely.   DVT Prophylaxis: Patient is on therapeutic Coumadin; pharmacy is asked to dose  Code Status: Full  Total time spent for admission equals 90 minutes  Jonny Ruiz 02/07/2011, 9:47 PM

## 2011-02-07 NOTE — ED Notes (Signed)
Gave Report to [employer]. Pt moved to room [address]. All belongings with pt.

## 2011-02-07 NOTE — ED Notes (Signed)
Contrast was given to pt. Pt informed to let us know when she is complete.

## 2011-02-07 NOTE — ED Notes (Signed)
Pt stated that she is having sharp abdominal pain. Pt is having r back pain with touch. Abdominal pain is extreme to touch. Pain onset is new. Abdomen is soft to touch. No CP, SOB, or N/V/D. Will continue to monitor.

## 2011-02-07 NOTE — ED Provider Notes (Signed)
History     CSN: [id number]  Arrival date & time 02/07/11  1454   First MD Initiated Contact with Patient 02/07/11 1613      Chief Complaint  Patient presents with  . Abdominal Pain   patient presents along with her son. She began having sharp right abdominal pain. Today. She says it is constant and stabbing coming on approximately every minutes. Her son states she was having some back pain earlier as well. She states she has not urinated since early this morning. She did have some nausea, vomiting, but denies diarrhea. She denies any chest pain or shortness of breath, denies any cough or fever. She states this pain is new. She has not had it before. Patient initially states she was allergic to morphine, but just states it made her kitchen was requesting a dose of morphine for pain control. She had no syncope, no numbness, weakness or tingling.  (Consider location/radiation/quality/duration/timing/severity/associated sxs/prior treatment) HPI  Past Medical History  Diagnosis Date  . Gout   . Hypercholesterolemia   . Diabetes mellitus   . Hypertension   . Thyroid disease   . Anemia   . Arthritis   . Constipation   . Chronic kidney disease     History reviewed. No pertinent past surgical history.  History reviewed. No pertinent family history.  History  Substance Use Topics  . Smoking status: Never Smoker   . Smokeless tobacco: Not on file  . Alcohol Use: No    OB History    Grav Para Term Preterm Abortions TAB SAB Ect Mult Living                  Review of Systems  All other systems reviewed and are negative.    Allergies  Celebrex; Codeine; and Morphine and related  Home Medications   Current Outpatient Rx  Name Route Sig Dispense Refill  . ALLOPURINOL 100 MG PO TABS Oral Take 100 mg by mouth daily.      Marland Kitchen AMLODIPINE BESYLATE 5 MG PO TABS Oral Take 5 mg by mouth daily.      Marland Kitchen VITAMIN D 1000 UNITS PO TABS Oral Take 1,000 Units by mouth daily.      .  FUROSEMIDE 20 MG PO TABS Oral Take 40 mg by mouth daily.      Marland Kitchen LEVOTHYROXINE SODIUM 50 MCG PO TABS Oral Take 50 mcg by mouth daily.      Marland Kitchen LOSARTAN POTASSIUM 100 MG PO TABS Oral Take 100 mg by mouth daily.      . ADULT MULTIVITAMIN W/MINERALS CH Oral Take 1 tablet by mouth daily.      Marland Kitchen NITROGLYCERIN 0.4 MG SL SUBL Sublingual Place 0.4 mg under the tongue every 5 (five) minutes as needed. For chest pains     . ONDANSETRON HCL 4 MG PO TABS Oral Take 4 mg by mouth every 8 (eight) hours as needed. For nausea     . TRAMADOL HCL 50 MG PO TABS Oral Take 100 mg by mouth every 6 (six) hours as needed. For pain     . WARFARIN SODIUM 4 MG PO TABS Oral Take 2-4 mg by mouth daily. Take 4mg  every day, except take 2mg  on Mondays       BP 139/69  Pulse 87  Temp(Src) 98.1 F (36.7 C) (Oral)  Resp 16  SpO2 96%  Physical Exam  Nursing note and vitals reviewed. Constitutional: She is oriented to person, place, and time. She appears well-developed and  well-nourished. No distress.       Appears to have some intermittent pain, uncomfortable, in no acute distress  HENT:  Head: Normocephalic and atraumatic.  Eyes: Conjunctivae and EOM are normal. Pupils are equal, round, and reactive to light.  Neck: Neck supple.  Cardiovascular: Normal rate and regular rhythm.  Exam reveals no gallop and no friction rub.   No murmur heard. Pulmonary/Chest: Breath sounds normal. She has no wheezes. She has no rales. She exhibits no tenderness.  Abdominal: Soft. Bowel sounds are normal. She exhibits no distension. There is no rebound and no guarding.       Diffuse tenderness, right upper quadrant area. No mass or pulsation. No rebound or guarding. Bowel sounds are normal.  Musculoskeletal: Normal range of motion. She exhibits no edema and no tenderness.  Neurological: She is alert and oriented to person, place, and time. No cranial nerve deficit. Coordination normal.  Skin: Skin is warm and dry. No rash noted.  Psychiatric:  She has a normal mood and affect.    ED Course  Procedures (including critical care time)   Labs Reviewed  CBC  DIFFERENTIAL  COMPREHENSIVE METABOLIC PANEL  LIPASE, BLOOD  I-STAT TROPONIN I   No results found.   No diagnosis found.    MDM  Pt is seen and examined;  Initial history and physical completed.  Will follow.         Date: 02/07/2011  Rate: 88  Rhythm: normal sinus rhythm  QRS Axis: right  Intervals: normal  ST/T Wave abnormalities: nonspecific ST changes  Conduction Disutrbances:right bundle branch block  Narrative Interpretation:   Old EKG Reviewed: changes noted  6:03 PM Reassessed in the hallway bed. Stable, pending CAT scan. Requesting additional dose of morphine. This has been ordered. Will follow closely until CAT scan returns   Results for orders placed during the hospital encounter of 02/07/11  CBC      Component Value Range   WBC 8.2  4.0 - 10.5 (K/uL)   RBC 4.37  3.87 - 5.11 (MIL/uL)   Hemoglobin 13.5  12.0 - 15.0 (g/dL)   HCT 16.1  09.6 - 04.5 (%)   MCV 89.2  78.0 - 100.0 (fL)   MCH 30.9  26.0 - 34.0 (pg)   MCHC 34.6  30.0 - 36.0 (g/dL)   RDW 40.9  81.1 - 91.4 (%)   Platelets 347  150 - 400 (K/uL)  DIFFERENTIAL      Component Value Range   Neutrophils Relative 64  43 - 77 (%)   Neutro Abs 5.2  1.7 - 7.7 (K/uL)   Lymphocytes Relative 24  12 - 46 (%)   Lymphs Abs 1.9  0.7 - 4.0 (K/uL)   Monocytes Relative 9  3 - 12 (%)   Monocytes Absolute 0.8  0.1 - 1.0 (K/uL)   Eosinophils Relative 2  0 - 5 (%)   Eosinophils Absolute 0.2  0.0 - 0.7 (K/uL)   Basophils Relative 1  0 - 1 (%)   Basophils Absolute 0.1  0.0 - 0.1 (K/uL)  COMPREHENSIVE METABOLIC PANEL      Component Value Range   Sodium 138  135 - 145 (mEq/L)   Potassium 4.7  3.5 - 5.1 (mEq/L)   Chloride 102  96 - 112 (mEq/L)   CO2 25  19 - 32 (mEq/L)   Glucose, Bld 102 (*) 70 - 99 (mg/dL)   BUN 29 (*) 6 - 23 (mg/dL)   Creatinine, Ser 7.82 (*) 0.50 - 1.10 (  mg/dL)   Calcium 16.1   8.4 - 10.5 (mg/dL)   Total Protein 7.6  6.0 - 8.3 (g/dL)   Albumin 3.9  3.5 - 5.2 (g/dL)   AST 25  0 - 37 (U/L)   ALT 12  0 - 35 (U/L)   Alkaline Phosphatase 61  39 - 117 (U/L)   Total Bilirubin 0.4  0.3 - 1.2 (mg/dL)   GFR calc non Af Amer 35 (*) >90 (mL/min)   GFR calc Af Amer 40 (*) >90 (mL/min)  LIPASE, BLOOD      Component Value Range   Lipase 53  11 - 59 (U/L)  POCT I-STAT TROPONIN I      Component Value Range   Troponin i, poc 0.01  0.00 - 0.08 (ng/mL)   Comment 3            Ct Abdomen Pelvis W Contrast  02/07/2011  *RADIOLOGY REPORT*  Clinical Data: Abdominal pain, vomiting  CT ABDOMEN AND PELVIS WITH CONTRAST  Technique:  Multidetector CT imaging of the abdomen and pelvis was performed following the standard protocol during bolus administration of intravenous contrast.  Contrast: 80mL OMNIPAQUE IOHEXOL 300 MG/ML IV SOLN  Comparison: CT abdomen 08/09/2006 the  Findings: Lung bases are clear. There is a focus of atelectasis at the inferior lingula which appears benign.  No pericardial fluid.  No new focal hepatic lesion.  Prior cholecystectomy.  The common bile duct is mildly dilated likely related to the cholecystectomy. This measures 9 mm and is increased from CT 2008.  There is no evidence of pancreatic ductal dilatation.  The spleen, adrenal glands, and kidneys are normal.  The stomach is normal.  Small hiatal hernia is present.  The small bowel and cecum are normal.  Appendix is not identified.  No pericecal inflammation. There are diverticula of the sigmoid colon without acute inflammation.  Abdominal aorta is normal caliber.  No retroperitoneal lymphadenopathy.  No periportal adenopathy.  No free fluid the pelvis.  The bladder is normal.  Post hysterectomy anatomy.  No adnexal abnormality.  No pelvic lymphadenopathy. Review of  bone windows demonstrates no aggressive osseous lesions.  IMPRESSION:  1.  No acute abdominal or pelvic process. 2.  Cholecystectomy. 3.  Sigmoid  diverticulosis without diverticulitis. 4.  Dilatation of the common bile duct is likely related the patient's age and prior cholecystectomy.  Original Report Authenticated By: Genevive Bi, M.D.    7:38 PM CT scan and labs. Discussed with patient and family. Initial studies are reassuring. At this time. White blood cell count is normal, lipase is normal, electrolytes are normal, glucose is normal.  CT scan of the abdomen and pelvis showed no acute process. Diverticulosis without diverticulitis. Mild elevation of the common bile duct, nonspecific. Patient continues to have reproducible tenderness, although at this time. There are no peritoneal signs. Plan will be to admit to medicine for pain control, and further evaluation. Does not need a surgical consult at this time. It may need one. Eventually.. Bladder lactates. [facility] has been paged.  Jacynda Brunke A. Patrica Duel, MD 02/07/11 0960

## 2011-02-07 NOTE — ED Notes (Signed)
Pt c/o sharp right side pain that is stabbing and cramping in nature starting today; pt sts N/V

## 2011-02-07 NOTE — Progress Notes (Addendum)
ANTICOAGULATION CONSULT NOTE - Initial Consult  Pharmacy Consult for coumadin Indication: PE and PAD  Allergies  Allergen Reactions  . Celebrex (Celecoxib) Diarrhea  . Codeine Nausea Only  . Morphine And Related Nausea Only  . Dilaudid (Hydromorphone Hcl) Rash    Patient Measurements: Height: 5' 4.5" (163.8 cm) Weight: 160 lb 4.8 oz (72.712 kg) IBW/kg (Calculated) : 55.85  Adjusted Body Weight:  Vital Signs: Temp: 98 F (36.7 C) (01/08 2158) Temp src: Oral (01/08 2158) BP: 145/77 mmHg (01/08 2158) Pulse Rate: 73  (01/08 2158)  Labs:  Basename 02/07/11 1614  HGB 13.5  HCT 39.0  PLT 347  APTT --  LABPROT --  INR --  HEPARINUNFRC --  CREATININE 1.32*  CKTOTAL --  CKMB --  TROPONINI --   Estimated Creatinine Clearance: 28.6 ml/min (by C-G formula based on Cr of 1.32).  Medical History: Past Medical History  Diagnosis Date  . Gout   . Hypercholesterolemia   . Diabetes mellitus   . Hypertension   . Thyroid disease   . Anemia   . Arthritis   . Constipation   . Chronic kidney disease   . Pulmonary embolism 1969  . Peripheral arterial occlusive disease     s/p thrombectomy right popliteal and tibial arteries and  embolectomy right superficial femoral artery  . Gout   . Lumbar stenosis   . CAD (coronary artery disease) 1972    s/p SEMI  . Fibromyalgia   . Hypothyroidism     Medications:  Prescriptions prior to admission  Medication Sig Dispense Refill  . allopurinol (ZYLOPRIM) 100 MG tablet Take 100 mg by mouth daily.        Marland Kitchen amLODipine (NORVASC) 5 MG tablet Take 5 mg by mouth daily.        . cholecalciferol (VITAMIN D) 1000 UNITS tablet Take 1,000 Units by mouth daily.        . furosemide (LASIX) 20 MG tablet Take 40 mg by mouth daily.        Marland Kitchen levothyroxine (SYNTHROID, LEVOTHROID) 50 MCG tablet Take 50 mcg by mouth daily.        Marland Kitchen losartan (COZAAR) 100 MG tablet Take 100 mg by mouth daily.        . Multiple Vitamin (MULITIVITAMIN WITH MINERALS) TABS  Take 1 tablet by mouth daily.        . nitroGLYCERIN (NITROSTAT) 0.4 MG SL tablet Place 0.4 mg under the tongue every 5 (five) minutes as needed. For chest pains       . ondansetron (ZOFRAN) 4 MG tablet Take 4 mg by mouth every 8 (eight) hours as needed. For nausea       . traMADol (ULTRAM) 50 MG tablet Take 100 mg by mouth every 6 (six) hours as needed. For pain       . warfarin (COUMADIN) 4 MG tablet Take 2-4 mg by mouth daily. Take 4mg  every day, except take 2mg  on Mondays        Assessment: 78 yo F on coumadin for PE and PAD, admitted with severe RUQ pain and N/V.  Home coumadin dose 4 mg daily except 2mg  on Mondays. Last dose take yesterday 02/05/10. INR in process.  General surgery to be consulted in am. ? If coumadin should be held for possible invasive procedure? Goal of Therapy:  INR 2-3   Plan:  F/u INR Len Childs T 02/07/2011,10:02 PM   Discussed with Dr. Jordan Hawks. We will hold coumadin and start heparin per pharmacy protocol  once INR <2 due to possibility of surgery pending general surgery consult in am. Herby Abraham 02/07/2011 10:21 PM Pager: [phone] INR = 1.41. Will start heparin drip with no bolus at 900 units/hr and check a heparin level 8 hours later for 0.3-0.7 goal. Len Childs T 02/07/2011 10:37 PM

## 2011-02-07 NOTE — ED Notes (Signed)
[id number] Ready 

## 2011-02-07 NOTE — ED Notes (Signed)
Received pt. In room 20 from hall pt. Alert and oriented, NAD noted at this time

## 2011-02-08 ENCOUNTER — Inpatient Hospital Stay (HOSPITAL_COMMUNITY): Payer: Medicare Other

## 2011-02-08 LAB — COMPREHENSIVE METABOLIC PANEL
Albumin: 3.4 g/dL — ABNORMAL LOW (ref 3.5–5.2)
BUN: 22 mg/dL (ref 6–23)
Chloride: 105 mEq/L (ref 96–112)
Creatinine, Ser: 1.35 mg/dL — ABNORMAL HIGH (ref 0.50–1.10)
GFR calc Af Amer: 39 mL/min — ABNORMAL LOW (ref >90)
Glucose, Bld: 96 mg/dL (ref 70–99)
Total Bilirubin: 0.5 mg/dL (ref 0.3–1.2)
Total Protein: 6.9 g/dL (ref 6.0–8.3)

## 2011-02-08 LAB — CBC
Hemoglobin: 13.2 g/dL (ref 12.0–15.0)
MCHC: 33.5 g/dL (ref 30.0–36.0)
Platelets: 327 10*3/uL (ref 150–400)
RBC: 4.33 MIL/uL (ref 3.87–5.11)

## 2011-02-08 LAB — HEPARIN LEVEL (UNFRACTIONATED)
Heparin Unfractionated: 0.38 IU/mL (ref 0.30–0.70)
Heparin Unfractionated: 0.44 IU/mL (ref 0.30–0.70)

## 2011-02-08 MED ORDER — ONDANSETRON HCL 4 MG/2ML IJ SOLN
4.0000 mg | Freq: Four times a day (QID) | INTRAMUSCULAR | Status: DC | PRN
  Administered 2011-02-08: 4 mg via INTRAVENOUS
  Filled 2011-02-08: qty 2

## 2011-02-08 MED ORDER — MORPHINE SULFATE 2 MG/ML IJ SOLN: 2.0000 mg | INTRAMUSCULAR | Status: DC | PRN

## 2011-02-08 MED ORDER — MORPHINE SULFATE 2 MG/ML IJ SOLN: 1.0000 mg | INTRAMUSCULAR | Status: AC

## 2011-02-08 NOTE — Progress Notes (Signed)
Pt C/o 6 of 10 pain after 1 mg morphine and now complaining of nausea. K Kirby notified , new orders written will continue  To monitor

## 2011-02-08 NOTE — Progress Notes (Signed)
ANTICOAGULATION CONSULT NOTE - Follow Up Consult  Pharmacy Consult for Heparin Indication: h/o PE/PAD  Allergies  Allergen Reactions  . Celebrex (Celecoxib) Diarrhea  . Codeine Nausea Only  . Morphine And Related Nausea Only  . Dilaudid (Hydromorphone Hcl) Rash    Patient Measurements: Height: 5' 4.5" (163.8 cm) Weight: 160 lb 4.8 oz (72.712 kg) IBW/kg (Calculated) : 55.85   Vital Signs: Temp: 98.1 F (36.7 C) (01/09 1352) Temp src: Oral (01/09 1352) BP: 130/72 mmHg (01/09 1352) Pulse Rate: 80  (01/09 1352)  Labs:  Basename 02/08/11 2010 02/08/11 0910 02/07/11 2118 02/07/11 1614  HGB -- 13.2 -- 13.5  HCT -- 39.4 -- 39.0  PLT -- 327 -- 347  APTT -- -- -- --  LABPROT -- -- 17.5* --  INR -- -- 1.41 --  HEPARINUNFRC 0.44 0.38 -- --  CREATININE -- 1.35* -- 1.32*  CKTOTAL -- -- -- --  CKMB -- -- -- --  TROPONINI -- -- -- --   Estimated Creatinine Clearance: 27.9 ml/min (by C-G formula based on Cr of 1.35).   Medications:  Scheduled:     . allopurinol  100 mg Oral Daily  . amLODipine  5 mg Oral Daily  . furosemide  40 mg Oral Daily  . levothyroxine  50 mcg Oral Daily  . losartan  100 mg Oral Daily  .  morphine injection  1 mg Intravenous NOW  . sodium chloride  3 mL Intravenous Q12H    Assessment: 78 y/o female patient receiving heparin infusion d/t INR subtherapeutic. Chronic coumadin for h/o PE/PAD. Heparin level therapeutic, no bleeding reported.  Admit with abdominal pain.   Goal of Therapy:  Heparin level 0.3-0.7 units/ml   Plan:  Continue heparin gtt at 900 units/hr and recheck level in AM.  Nadara Mustard, PharmD., MS Clinical Pharmacist Pager:  [phone] 02/08/2011,9:10 PM

## 2011-02-08 NOTE — Plan of Care (Signed)
Problem: Consults Goal: General Medical Patient Education See Patient Education Module for specific education. Outcome: Progressing Room st up explained to pt, call bell in hand  Problem: Phase I Progression Outcomes Goal: Pain controlled with appropriate interventions Outcome: Progressing With ordered meds Goal: Voiding-avoid urinary catheter unless indicated Outcome: Progressing Pt voiding

## 2011-02-08 NOTE — Progress Notes (Signed)
PROGRESS NOTE  Katherine Cuevas ZOX:[id number] DOB: Oct 07, 1919 DOA: 02/07/2011 PCP: Daisy Floro, MD, MD  Brief narrative: 78 year old woman presents the emergency department with severe right upper quadrant pain associated with nausea and occasional vomiting for the last 4-5 days. Chest x-ray was unrevealing as was CT of the abdomen and pelvis. She was admitted for acute right upper quadrant abdominal pain and nausea. Case was discussed by the admitting physician with Dr. Edilia Bo who did not feel that this was a vascular phenomenon.  Past medical history: Gout, peripheral vascular disease status post thrombectomy right popliteal and tibial arteries and embolectomy right superficial femoral artery,, hyperlipidemia, diabetes mellitus, hypertension, chronic kidney disease, hypothyroidism?, pulmonary embolism (on Coumadin), lumbar stenosis, fibromyalgia, coronary artery disease, cholecystectomy  Consultants:  None  Procedures:  None  Antibiotics:  None  Interim History: Interval documentation review. No laboratory studies this morning. Subjective: Feels better. Still has some right upper and right lower quadrant pain. Would like to eat.  Objective: Filed Vitals:   02/07/11 1904 02/07/11 2113 02/07/11 2158 02/08/11 0520  BP: 120/71 128/66 145/77 151/68  Pulse: 92 82 73 71  Temp: 98.2 F (36.8 C) 98.3 F (36.8 C) 98 F (36.7 C) 98.2 F (36.8 C)  TempSrc: Oral Oral Oral   Resp: 20 16 18 18   Height:   5' 4.5" (1.638 m)   Weight:   72.712 kg (160 lb 4.8 oz)   SpO2: 90% 94% 92% 94%    Intake/Output Summary (Last 24 hours) at 02/08/11 0909 Last data filed at 02/08/11 0600  Gross per 24 hour  Intake    588 ml  Output    750 ml  Net   -162 ml    Exam:  General: Appears calm and comfortable. Lying flat in bed. Cardiovascular: Regular rate and rhythm. No murmur, rub or gallop. No lower extremity edema. Respiratory: Clear to auscultation bilaterally. No wheezes, rales or  rhonchi. Normal respiratory effort. Abdomen: Positive bowel sounds. No rebound or guarding in the mid abdomen, left upper and left lower quadrants. Exquisite tenderness right upper quadrant and right lower quadrant/groin area. Skin: Appears grossly unremarkable. Musculoskeletal: Grossly normal tone and strength in bilateral lower extremities. Easily lifts right leg off the bed without pain. Right foot warm and dry.  Data Reviewed: Basic Metabolic Panel:  Lab 02/07/11 8119  NA 138  K 4.7  CL 102  CO2 25  GLUCOSE 102*  BUN 29*  CREATININE 1.32*  CALCIUM 10.0  MG --  PHOS --   Liver Function Tests:  Lab 02/07/11 1614  AST 25  ALT 12  ALKPHOS 61  BILITOT 0.4  PROT 7.6  ALBUMIN 3.9    Lab 02/07/11 1614  LIPASE 53  AMYLASE --   CBC:  Lab 02/07/11 1614  WBC 8.2  NEUTROABS 5.2  HGB 13.5  HCT 39.0  MCV 89.2  PLT 347   Studies: Dg Chest 2 View  02/07/2011  *RADIOLOGY REPORT*  Clinical Data: Abdominal pain.  CHEST - 2 VIEW  Comparison: 03/04/2008.  Findings: Right axillary dissection clips.  Cholecystectomy clips. Surgical clips are present at the left thoracic inlet.  Stable rightward tracheal deviation at the level of the aortic arch. There is no airspace disease or effusion.  Bosselation of the right hemidiaphragm is present.  Cardiopericardial silhouette appears within normal limits.  Aortic arch atherosclerosis.  IMPRESSION: No acute cardiopulmonary disease.  Postoperative changes cholecystectomy and right axillary dissection.  Original Report Authenticated By: Andreas Newport, M.D.   Ct Abdomen  Pelvis W Contrast  02/07/2011  *RADIOLOGY REPORT*  Clinical Data: Abdominal pain, vomiting  CT ABDOMEN AND PELVIS WITH CONTRAST  Technique:  Multidetector CT imaging of the abdomen and pelvis was performed following the standard protocol during bolus administration of intravenous contrast.  Contrast: 80mL OMNIPAQUE IOHEXOL 300 MG/ML IV SOLN  Comparison: CT abdomen 08/09/2006 the   Findings: Lung bases are clear. There is a focus of atelectasis at the inferior lingula which appears benign.  No pericardial fluid.  No new focal hepatic lesion.  Prior cholecystectomy.  The common bile duct is mildly dilated likely related to the cholecystectomy. This measures 9 mm and is increased from CT 2008.  There is no evidence of pancreatic ductal dilatation.  The spleen, adrenal glands, and kidneys are normal.  The stomach is normal.  Small hiatal hernia is present.  The small bowel and cecum are normal.  Appendix is not identified.  No pericecal inflammation. There are diverticula of the sigmoid colon without acute inflammation.  Abdominal aorta is normal caliber.  No retroperitoneal lymphadenopathy.  No periportal adenopathy.  No free fluid the pelvis.  The bladder is normal.  Post hysterectomy anatomy.  No adnexal abnormality.  No pelvic lymphadenopathy. Review of  bone windows demonstrates no aggressive osseous lesions.  IMPRESSION:  1.  No acute abdominal or pelvic process. 2.  Cholecystectomy. 3.  Sigmoid diverticulosis without diverticulitis. 4.  Dilatation of the common bile duct is likely related the patient's age and prior cholecystectomy.  Original Report Authenticated By: Genevive Bi, M.D.    Scheduled Meds:   . allopurinol  100 mg Oral Daily  . amLODipine  5 mg Oral Daily  . furosemide  40 mg Oral Daily  . levothyroxine  50 mcg Oral Daily  . losartan  100 mg Oral Daily  .  morphine injection  1 mg Intravenous NOW  .  morphine injection  2 mg Intravenous Once  .  morphine injection  2 mg Intravenous Once  . ondansetron (ZOFRAN) IV  4 mg Intravenous Once  . sodium chloride  500 mL Intravenous Once  . sodium chloride  3 mL Intravenous Q12H  . DISCONTD: sodium chloride  1,000 mL Intravenous Once   Continuous Infusions:   . sodium chloride 75 mL/hr at 02/07/11 2211  . heparin 900 Units/hr (02/08/11 0000)     Assessment/Plan: 1. Right upper quadrant abdominal pain:  Etiology unclear but apparently resolving by report. She does note vomiting preceded this pain. Laboratory studies and imaging are unrevealing. Although she has significant tenderness on examination she has no signs of peritonitis. Will check a right upper quadrant ultrasound and repeat laboratory studies. 2. Right lower quadrant/groin pain: She has had a history of some kind of pelvic fracture although she denies any recent falls. She certainly has no pain with movement of her leg. Nevertheless will check an AP pelvis film. 3. Diabetes mellitus: Stable. Diet controlled. 4. History of pulmonary embolism: Resume warfarin on discharge. 5. Gout: Stable. 6. History peripheral vascular disease: I agree with initial impression this is not appear to be an acute issue.  Code Status: Full code  Disposition Plan: Home when improved.   Brendia Sacks, MD  [facility] Pager [phone] 02/08/2011, 9:09 AM    LOS: 1 day

## 2011-02-08 NOTE — Progress Notes (Signed)
ANTICOAGULATION CONSULT NOTE - Follow Up Consult  Pharmacy Consult for Heparin Indication: h/o PE/PAD  Allergies  Allergen Reactions  . Celebrex (Celecoxib) Diarrhea  . Codeine Nausea Only  . Morphine And Related Nausea Only  . Dilaudid (Hydromorphone Hcl) Rash    Patient Measurements: Height: 5' 4.5" (163.8 cm) Weight: 160 lb 4.8 oz (72.712 kg) IBW/kg (Calculated) : 55.85   Vital Signs: Temp: 98.2 F (36.8 C) (01/09 0520) BP: 151/68 mmHg (01/09 0520) Pulse Rate: 71  (01/09 0520)  Labs:  Basename 02/08/11 0910 02/07/11 2118 02/07/11 1614  HGB 13.2 -- 13.5  HCT 39.4 -- 39.0  PLT 327 -- 347  APTT -- -- --  LABPROT -- 17.5* --  INR -- 1.41 --  HEPARINUNFRC 0.38 -- --  CREATININE 1.35* -- 1.32*  CKTOTAL -- -- --  CKMB -- -- --  TROPONINI -- -- --   Estimated Creatinine Clearance: 27.9 ml/min (by C-G formula based on Cr of 1.35).   Medications:  Scheduled:    . allopurinol  100 mg Oral Daily  . amLODipine  5 mg Oral Daily  . furosemide  40 mg Oral Daily  . levothyroxine  50 mcg Oral Daily  . losartan  100 mg Oral Daily  .  morphine injection  1 mg Intravenous NOW  .  morphine injection  2 mg Intravenous Once  .  morphine injection  2 mg Intravenous Once  . ondansetron (ZOFRAN) IV  4 mg Intravenous Once  . sodium chloride  500 mL Intravenous Once  . sodium chloride  3 mL Intravenous Q12H  . DISCONTD: sodium chloride  1,000 mL Intravenous Once    Assessment: 78 y/o female patient receiving heparin infusion d/t INR subtherapeutic. Chronic coumadin for h/o PE/PAD. Heparin level therapeutic, no bleeding reported.  Admit with abdominal pain.   Goal of Therapy:  Heparin level 0.3-0.7 units/ml   Plan:  Continue heparin gtt at 900 units/hr and recheck level in 8 hours.  Verlene Mayer, PharmD, BCPS Pager [phone] 02/08/2011,11:26 AM

## 2011-02-09 LAB — CBC
Hemoglobin: 13.5 g/dL (ref 12.0–15.0)
MCH: 30.1 pg (ref 26.0–34.0)
MCHC: 33.3 g/dL (ref 30.0–36.0)
Platelets: 350 10*3/uL (ref 150–400)

## 2011-02-09 LAB — URINALYSIS, ROUTINE W REFLEX MICROSCOPIC
Bilirubin Urine: NEGATIVE
Glucose, UA: NEGATIVE mg/dL
Hgb urine dipstick: NEGATIVE
Specific Gravity, Urine: 1.006 (ref 1.005–1.030)
pH: 6.5 (ref 5.0–8.0)

## 2011-02-09 LAB — COMPREHENSIVE METABOLIC PANEL
ALT: 20 U/L (ref 0–35)
AST: 49 U/L — ABNORMAL HIGH (ref 0–37)
Albumin: 3.8 g/dL (ref 3.5–5.2)
Alkaline Phosphatase: 64 U/L (ref 39–117)
Calcium: 9.3 mg/dL (ref 8.4–10.5)
GFR calc Af Amer: 37 mL/min — ABNORMAL LOW (ref >90)
Glucose, Bld: 101 mg/dL — ABNORMAL HIGH (ref 70–99)
Potassium: 3.9 mEq/L (ref 3.5–5.1)
Sodium: 139 mEq/L (ref 135–145)
Total Protein: 7.5 g/dL (ref 6.0–8.3)

## 2011-02-09 LAB — HEPARIN LEVEL (UNFRACTIONATED): Heparin Unfractionated: 0.61 IU/mL (ref 0.30–0.70)

## 2011-02-09 NOTE — Progress Notes (Signed)
Patient seen and I agree with exam, assessment and plan.

## 2011-02-09 NOTE — Progress Notes (Signed)
Patient ID: Katherine Cuevas, female   DOB: 17-Oct-1919, 78 y.o.   MRN: [id number] PROGRESS NOTE  Katherine Cuevas:[id number] DOB: [birth year] DOA: 02/07/2011 PCP: Katherine Floro, MD, MD  Brief narrative: 78 year old woman presents the emergency department with severe right upper quadrant pain associated with nausea and occasional vomiting for the last 4-5 days. Chest x-ray was unrevealing as was CT of the abdomen and pelvis. She was admitted for acute right upper quadrant abdominal pain and nausea. Case was discussed by the admitting physician with Dr. Edilia Cuevas who did not feel that this was a vascular phenomenon.  Past medical history: Gout, peripheral vascular disease status post thrombectomy right popliteal and tibial arteries and embolectomy right superficial femoral artery,, hyperlipidemia, diabetes mellitus, hypertension, chronic kidney disease, hypothyroidism?, pulmonary embolism (on Coumadin), lumbar stenosis, fibromyalgia, coronary artery disease, cholecystectomy  Consultants:  None  Procedures:  None  Antibiotics:  None  Interim History: The patient had 2 diarrhea bowel movements earlier today.    Subjective: Reports she feels fine.  Desires discharge.  Complains of Urinary frequency & Right flank pain.  Objective: Filed Vitals:   02/08/11 2205 02/08/11 2330 02/09/11 0525 02/09/11 1339  BP: 137/71 142/73 157/71 131/66  Pulse: 71 78 77 87  Temp: 98.6 F (37 C) 98 F (36.7 C) 97.8 F (36.6 C) 98.3 F (36.8 C)  TempSrc: Oral Oral Oral Oral  Resp: 18 22 20 20   Height:      Weight:      SpO2: 96% 98% 97% 96%    Intake/Output Summary (Last 24 hours) at 02/09/11 1432 Last data filed at 02/09/11 1340  Gross per 24 hour  Intake    952 ml  Output      0 ml  Net    952 ml    Exam:  General: Appears calm and comfortable. Eating during my exam. Cardiovascular: Regular rate and rhythm. No murmur, rub or gallop. No lower extremity edema. Respiratory: Clear to  auscultation bilaterally. No wheezes, rales or rhonchi. Normal respiratory effort. Abdomen: Positive bowel sounds. No rebound or guarding in the mid abdomen, left upper and left lower quadrants. Mild tenderness to palpation on Rt CVA. Skin: Appears grossly unremarkable. Musculoskeletal: Grossly normal tone and strength in bilateral lower extremities. Easily lifts right leg off the bed without pain. Right foot warm and dry.  Data Reviewed: Basic Metabolic Panel:  Lab 02/09/11 9562 02/08/11 0910 02/07/11 1614  NA 139 140 138  K 3.9 4.9 --  CL 102 105 102  CO2 26 25 25   GLUCOSE 101* 96 102*  BUN 20 22 29*  CREATININE 1.42* 1.35* 1.32*  CALCIUM 9.3 9.3 10.0  MG -- -- --  PHOS -- -- --   Liver Function Tests:  Lab 02/09/11 0530 02/08/11 0910 02/07/11 1614  AST 49* 31 25  ALT 20 13 12   ALKPHOS 64 58 61  BILITOT 0.4 0.5 0.4  PROT 7.5 6.9 7.6  ALBUMIN 3.8 3.4* 3.9    Lab 02/07/11 1614  LIPASE 53  AMYLASE --   CBC:  Lab 02/09/11 0530 02/08/11 0910 02/07/11 1614  WBC 8.9 8.4 8.2  NEUTROABS -- -- 5.2  HGB 13.5 13.2 13.5  HCT 40.6 39.4 39.0  MCV 90.4 91.0 89.2  PLT 350 327 347   Studies: Dg Chest 2 View  02/07/2011  *RADIOLOGY REPORT*  Clinical Data: Abdominal pain.  CHEST - 2 VIEW  Comparison: 03/04/2008.  Findings: Right axillary dissection clips.  Cholecystectomy clips. Surgical clips are present at  the left thoracic inlet.  Stable rightward tracheal deviation at the level of the aortic arch. There is no airspace disease or effusion.  Bosselation of the right hemidiaphragm is present.  Cardiopericardial silhouette appears within normal limits.  Aortic arch atherosclerosis.  IMPRESSION: No acute cardiopulmonary disease.  Postoperative changes cholecystectomy and right axillary dissection.  Original Report Authenticated By: Katherine Cuevas, M.D.   Ct Abdomen Pelvis W Contrast  02/07/2011  *RADIOLOGY REPORT*  Clinical Data: Abdominal pain, vomiting  CT ABDOMEN AND PELVIS WITH  CONTRAST  Technique:  Multidetector CT imaging of the abdomen and pelvis was performed following the standard protocol during bolus administration of intravenous contrast.  Contrast: 80mL OMNIPAQUE IOHEXOL 300 MG/ML IV SOLN  Comparison: CT abdomen 08/09/2006 the  Findings: Lung bases are clear. There is a focus of atelectasis at the inferior lingula which appears benign.  No pericardial fluid.  No new focal hepatic lesion.  Prior cholecystectomy.  The common bile duct is mildly dilated likely related to the cholecystectomy. This measures 9 mm and is increased from CT 2008.  There is no evidence of pancreatic ductal dilatation.  The spleen, adrenal glands, and kidneys are normal.  The stomach is normal.  Small hiatal hernia is present.  The small bowel and cecum are normal.  Appendix is not identified.  No pericecal inflammation. There are diverticula of the sigmoid colon without acute inflammation.  Abdominal aorta is normal caliber.  No retroperitoneal lymphadenopathy.  No periportal adenopathy.  No free fluid the pelvis.  The bladder is normal.  Post hysterectomy anatomy.  No adnexal abnormality.  No pelvic lymphadenopathy. Review of  bone windows demonstrates no aggressive osseous lesions.  IMPRESSION:  1.  No acute abdominal or pelvic process. 2.  Cholecystectomy. 3.  Sigmoid diverticulosis without diverticulitis. 4.  Dilatation of the common bile duct is likely related the patient's age and prior cholecystectomy.  Original Report Authenticated By: Katherine Cuevas, M.D.    Scheduled Meds:    . allopurinol  100 mg Oral Daily  . amLODipine  5 mg Oral Daily  . furosemide  40 mg Oral Daily  . levothyroxine  50 mcg Oral Daily  . losartan  100 mg Oral Daily  .  morphine injection  1 mg Intravenous NOW  . sodium chloride  3 mL Intravenous Q12H   Continuous Infusions:    . heparin 900 Units/hr (02/09/11 4098)     Assessment/Plan: 1. Right upper quadrant abdominal pain: Etiology unclear but  apparently resolving by report. She does note vomiting preceded this pain. Laboratory studies and imaging are unrevealing.  Abdominal U/S unrevealing.  Patient eating well!  2. Right lower quadrant/groin pain: She has had a history of some kind of pelvic fracture although she denies any recent falls. She certainly has no pain with movement of her leg. 3. Diabetes mellitus: Stable. Diet controlled. 4. History of pulmonary embolism: Resume warfarin on discharge. Awaiting loading dosage of coumadin from pharmacy.  INR to be checked in Dr. Tenny Craw office 1/11. 5. Gout: Stable. 6. History peripheral vascular disease: I agree with initial impression this is not appear to be an acute issue.  Code Status: Full code  Disposition Plan: Home when improved.  Follow up scheduled with PCP.   Algis Downs, [place] [facility] [phone] 02/09/2011, 2:32 PM    LOS: 2 days

## 2011-02-09 NOTE — Discharge Summary (Signed)
Patient seen. I agree with discharge summary.

## 2011-02-09 NOTE — Discharge Summary (Signed)
Patient ID: Katherine Cuevas MRN: [id number] DOB/AGE: 1919-09-18 78 y.o.  Admit date: 02/07/2011 Discharge date: 02/09/2011  Primary Care Physician:  Daisy Floro, MD, MD  Discharge Diagnoses:   Present on Admission:  .Abdominal pain, acute, right upper quadrant .Nausea and vomiting prior to admission *Sub Theraputic INR with a history of PE and PAD    Current Discharge Medication List    CONTINUE these medications which have NOT CHANGED   Details  allopurinol (ZYLOPRIM) 100 MG tablet Take 100 mg by mouth daily.      amLODipine (NORVASC) 5 MG tablet Take 5 mg by mouth daily.      cholecalciferol (VITAMIN D) 1000 UNITS tablet Take 1,000 Units by mouth daily.      furosemide (LASIX) 20 MG tablet Take 40 mg by mouth daily.      levothyroxine (SYNTHROID, LEVOTHROID) 50 MCG tablet Take 50 mcg by mouth daily.      losartan (COZAAR) 100 MG tablet Take 100 mg by mouth daily.      Multiple Vitamin (MULITIVITAMIN WITH MINERALS) TABS Take 1 tablet by mouth daily.      nitroGLYCERIN (NITROSTAT) 0.4 MG SL tablet Place 0.4 mg under the tongue every 5 (five) minutes as needed. For chest pains     ondansetron (ZOFRAN) 4 MG tablet Take 4 mg by mouth every 8 (eight) hours as needed. For nausea     traMADol (ULTRAM) 50 MG tablet Take 100 mg by mouth every 6 (six) hours as needed. For pain     warfarin (COUMADIN) 4 MG tablet Take 2-4 mg by mouth daily. Take 4mg  every day, except take 2mg  on Mondays        Consults:  None  Brief H and P: From the admission note:  78 year old female with an extensive history of peripheral vascular disease, presenting with severe right upper quadrant abdominal pain associated to nausea with occasional vomiting for the last 4-5 days. Patient denies dysuria, frequency or hematuria and in fact she states she has not urinated much since this morning. She denies diarrhea, constipation, hematochezia or melena. She had a normal bowel movement this morning. She  denies fever or chills. The pain is described as sharp, stabbing, 9-10 out of 10 in severity, intermittent, with some radiation to right flank, worse by nothing and improved by 2 doses of morphine in the emergency department. The patient has a history of appendectomy and cholecystectomy many years ago.   In the emergency department she underwent a comprehensive workup that included a CT scan of the abdomen and pelvis with contrast and she was just found with diverticulosis without diverticulitis. No evidence of ischemic bowel or bowel obstruction. Case was discussed by the admitting physician with Dr. Edilia Bo who did not feel that this was a vascular phenomenon.  Abdominal ultrasound was completed and did not show a source of pain or vomiting.  The patient reported that she has stage 3 CKD and small scarred kidneys.  Katherine Cuevas reports that overnight on the night of her admission her pain changed and improved drastically. She felt as though she may have passed a kidney stone.  Her pain was much decreased on Wednesday and her diet was resumed. She began eating well without nausea or vomiting. On Thursday, she complained of urinary frequency and had one diarrheal bowel movement. She was tender to palpation on her right flank.  However the patient appeared well.  She was able to get up and walk around, and she strongly desired discharge  to home. Her family is in town from [place] to celebrate her 89th birthday.   Because of the urinary frequency we will check a second u/a this afternoon before discharge.   Her Coumadin was been held secondary to the possibility of surgery, and she was started on heparin drip.  Her INR on admission 02/07/11 was 1.41.  Her Coumadin was held for the 2 days that she was here and it will be restarted at discharge this evening.  She has an appointment in her PCP's office (02/15/11) for an INR check and hospital follow up.  The rest of her co-morbidities (DM, Gout, PVD) remained  quiet during this hospitalization.  Physical Exam on Discharge: General: Alert, awake, oriented x3, in no acute distress. HEENT: No bruits, no goiter. Heart: Regular rate and rhythm, without murmurs, rubs, gallops. Lungs: Clear to auscultation bilaterally. Abdomen: Soft, nontender, nondistended, positive bowel sounds. Extremities: No clubbing cyanosis or edema with positive pedal pulses. Neuro: Grossly intact, nonfocal.  Filed Vitals:   02/08/11 1352 02/08/11 2205 02/08/11 2330 02/09/11 0525  BP: 130/72 137/71 142/73 157/71  Pulse: 80 71 78 77  Temp: 98.1 F (36.7 C) 98.6 F (37 C) 98 F (36.7 C) 97.8 F (36.6 C)  TempSrc: Oral Oral Oral Oral  Resp: 20 18 22 20   Height:      Weight:      SpO2: 93% 96% 98% 97%     Intake/Output Summary (Last 24 hours) at 02/09/11 1322 Last data filed at 02/09/11 0900  Gross per 24 hour  Intake    712 ml  Output      0 ml  Net    712 ml    Basic Metabolic Panel:  Lab 02/09/11 6213 02/08/11 0910  NA 139 140  K 3.9 4.9  CL 102 105  CO2 26 25  GLUCOSE 101* 96  BUN 20 22  CREATININE 1.42* 1.35*  CALCIUM 9.3 9.3  MG -- --  PHOS -- --   Liver Function Tests:  Lab 02/09/11 0530 02/08/11 0910  AST 49* 31  ALT 20 13  ALKPHOS 64 58  BILITOT 0.4 0.5  PROT 7.5 6.9  ALBUMIN 3.8 3.4*    Lab 02/07/11 1614  LIPASE 53  AMYLASE --   CBC:  Lab 02/09/11 0530 02/08/11 0910 02/07/11 1614  WBC 8.9 8.4 --  NEUTROABS -- -- 5.2  HGB 13.5 13.2 --  HCT 40.6 39.4 --  MCV 90.4 91.0 --  PLT 350 327 --   CBG:  Lab 02/08/11 2334  GLUCAP 109*   Coagulation:  Lab 02/07/11 2118  LABPROT 17.5*  INR 1.41   Urinalysis:  Results for Katherine Cuevas, Katherine Cuevas (MRN [id number]) as of 02/09/2011 13:04  Ref. Range 02/07/2011 20:29  Color, Urine Latest Range: YELLOW  YELLOW  APPearance Latest Range: CLEAR  CLEAR  Specific Gravity, Urine Latest Range: 1.005-1.030  1.010  pH Latest Range: 5.0-8.0  7.0  Glucose, UA Latest Range: NEGATIVE mg/dL NEGATIVE    Bilirubin Urine Latest Range: NEGATIVE  NEGATIVE  Ketones, ur Latest Range: NEGATIVE mg/dL NEGATIVE  Protein Latest Range: NEGATIVE mg/dL NEGATIVE  Urobilinogen, UA Latest Range: 0.0-1.0 mg/dL 0.2  Nitrite Latest Range: NEGATIVE  NEGATIVE  Leukocytes, UA Latest Range: NEGATIVE  SMALL (A)  WBC, UA Latest Range: <3 WBC/hpf 3-6  Squamous Epithelial / LPF Latest Range: RARE  RARE  Bacteria, UA Latest Range: RARE  RARE     Significant Diagnostic Studies:  Dg Chest 2 View  02/07/2011  *RADIOLOGY  REPORT*  Clinical Data: Abdominal pain.  CHEST - 2 VIEW   IMPRESSION: No acute cardiopulmonary disease.  Postoperative changes cholecystectomy and right axillary dissection.    Dg Pelvis 1-2 Views  02/08/2011  *RADIOLOGY REPORT*  Clinical Data: Right anterior superior pelvic pain.  No known injuries.  PELVIS - 1-2 VIEW IMPRESSION: No acute osseous abnormalities.  Degenerative changes involving the sacroiliac joints and the visualized lower lumbar spine.    US Abdomen Complete 02/08/2011  *RADIOLOGY REPORT*  Clinical Data:  Right upper quadrant pain  COMPLETE ABDOMINAL ULTRASOUND  Comparison:  CT 02/07/2011  Findings:  Gallbladder:  Surgically absent gallbladder  Common bile duct:  3.7 mm.  No biliary dilatation is present.  Liver:  No focal lesion identified.  Within normal limits in parenchymal echogenicity.  IVC:  Not well seen  Pancreas:  Not well seen  Spleen:  5.5 cm.  Right Kidney:  8.3 cm.  Negative for obstruction.  Negative for mass.  Left Kidney:  8.8 cm.  Negative for obstruction.  No mass lesion.  Abdominal aorta:  Atherosclerotic aorta without aneurysm.  IMPRESSION: The gallbladder is surgically absent.  No biliary ductal dilatation is present.  No acute abnormality.    Ct Abdomen Pelvis W Contrast 02/07/2011  *RADIOLOGY REPORT*  Clinical Data: Abdominal pain, vomiting  CT ABDOMEN AND PELVIS WITH CONTRAST    IMPRESSION:  1.  No acute abdominal or pelvic process. 2.  Cholecystectomy. 3.  Sigmoid  diverticulosis without diverticulitis. 4.  Dilatation of the common bile duct is likely related the patient's age and prior cholecystectomy.   Disposition and Follow-up:  The patient will be seen by Dr. Tenny Craw (PCP) on 02/17/11 at 11:45 for hospital follow up and INR check.  Time spent on Discharge: 40 min  Signed: Stephani Police 02/09/2011, 1:22 PM [phone]

## 2011-02-09 NOTE — Progress Notes (Signed)
PT/OT/SLP Cancellation Note  Treatment cancelled today due to pt discharged from hospital prior to PT eval.   Serenitie Vinton L. Tressia Labrum DPT [phone] 02/09/2011

## 2011-02-09 NOTE — Progress Notes (Signed)
02/09/2011 Rick Warn[facility] Case Management Note [phone]  Utilization review completed.  

## 2011-02-09 NOTE — Progress Notes (Signed)
Katherine Cuevas to be D/C'd Home per MD order.  Discussed with the patient and all questions fully answered.   Katherine, Cuevas  Home Medication Instructions UEA:[id number]   Printed on:02/09/11 1627  Medication Information                    warfarin (COUMADIN) 4 MG tablet Take 2-4 mg by mouth daily. Take 4mg  every day, except take 2mg  on Mondays           ondansetron (ZOFRAN) 4 MG tablet Take 4 mg by mouth every 8 (eight) hours as needed. For nausea            Multiple Vitamin (MULITIVITAMIN WITH MINERALS) TABS Take 1 tablet by mouth daily.             cholecalciferol (VITAMIN D) 1000 UNITS tablet Take 1,000 Units by mouth daily.             nitroGLYCERIN (NITROSTAT) 0.4 MG SL tablet Place 0.4 mg under the tongue every 5 (five) minutes as needed. For chest pains            amLODipine (NORVASC) 5 MG tablet Take 5 mg by mouth daily.             losartan (COZAAR) 100 MG tablet Take 100 mg by mouth daily.             furosemide (LASIX) 20 MG tablet Take 40 mg by mouth daily.             traMADol (ULTRAM) 50 MG tablet Take 100 mg by mouth every 6 (six) hours as needed. For pain            levothyroxine (SYNTHROID, LEVOTHROID) 50 MCG tablet Take 50 mcg by mouth daily.             allopurinol (ZYLOPRIM) 100 MG tablet Take 100 mg by mouth daily.               VVS, Skin clean, dry and intact without evidence of skin break down, no evidence of skin tears noted. IV catheter discontinued intact. Site without signs and symptoms of complications. Dressing and pressure applied.  An After Visit Summary was printed and given to the patient. Patient escorted via WC, and D/C home via private auto.  Driggers, Rae Roam 02/09/2011 4:27 PM

## 2011-12-14 ENCOUNTER — Encounter: Payer: Self-pay | Admitting: Vascular Surgery

## 2012-01-15 ENCOUNTER — Encounter (HOSPITAL_COMMUNITY): Payer: Self-pay | Admitting: Adult Health

## 2012-01-15 ENCOUNTER — Emergency Department (HOSPITAL_COMMUNITY)
Admission: EM | Admit: 2012-01-15 | Discharge: 2012-01-16 | Disposition: A | Discharge status: Home / Self Care | Payer: Medicare Other | Attending: Emergency Medicine | Admitting: Emergency Medicine

## 2012-01-15 DIAGNOSIS — Z862 Personal history of diseases of the blood and blood-forming organs and certain disorders involving the immune mechanism: Secondary | ICD-10-CM | POA: Insufficient documentation

## 2012-01-15 DIAGNOSIS — M7989 Other specified soft tissue disorders: Secondary | ICD-10-CM | POA: Insufficient documentation

## 2012-01-15 DIAGNOSIS — E079 Disorder of thyroid, unspecified: Secondary | ICD-10-CM | POA: Insufficient documentation

## 2012-01-15 DIAGNOSIS — R079 Chest pain, unspecified: Secondary | ICD-10-CM | POA: Insufficient documentation

## 2012-01-15 DIAGNOSIS — M79609 Pain in unspecified limb: Secondary | ICD-10-CM | POA: Insufficient documentation

## 2012-01-15 DIAGNOSIS — M79606 Pain in leg, unspecified: Secondary | ICD-10-CM

## 2012-01-15 DIAGNOSIS — E039 Hypothyroidism, unspecified: Secondary | ICD-10-CM | POA: Insufficient documentation

## 2012-01-15 DIAGNOSIS — Z8679 Personal history of other diseases of the circulatory system: Secondary | ICD-10-CM | POA: Insufficient documentation

## 2012-01-15 DIAGNOSIS — M109 Gout, unspecified: Secondary | ICD-10-CM | POA: Insufficient documentation

## 2012-01-15 DIAGNOSIS — Z8739 Personal history of other diseases of the musculoskeletal system and connective tissue: Secondary | ICD-10-CM | POA: Insufficient documentation

## 2012-01-15 DIAGNOSIS — Z86711 Personal history of pulmonary embolism: Secondary | ICD-10-CM | POA: Insufficient documentation

## 2012-01-15 DIAGNOSIS — N189 Chronic kidney disease, unspecified: Secondary | ICD-10-CM | POA: Insufficient documentation

## 2012-01-15 DIAGNOSIS — I2510 Atherosclerotic heart disease of native coronary artery without angina pectoris: Secondary | ICD-10-CM | POA: Insufficient documentation

## 2012-01-15 DIAGNOSIS — Z7901 Long term (current) use of anticoagulants: Secondary | ICD-10-CM | POA: Insufficient documentation

## 2012-01-15 DIAGNOSIS — I129 Hypertensive chronic kidney disease with stage 1 through stage 4 chronic kidney disease, or unspecified chronic kidney disease: Secondary | ICD-10-CM | POA: Insufficient documentation

## 2012-01-15 DIAGNOSIS — E7800 Pure hypercholesterolemia, unspecified: Secondary | ICD-10-CM | POA: Insufficient documentation

## 2012-01-15 DIAGNOSIS — Z79899 Other long term (current) drug therapy: Secondary | ICD-10-CM | POA: Insufficient documentation

## 2012-01-15 MED ORDER — MORPHINE SULFATE 4 MG/ML IJ SOLN
4.0000 mg | Freq: Once | INTRAMUSCULAR | Status: AC
  Administered 2012-01-16: 4 mg via INTRAVENOUS
  Filled 2012-01-15: qty 1

## 2012-01-15 MED ORDER — POTASSIUM CHLORIDE IN NACL 20-0.9 MEQ/L-% IV SOLN
Freq: Once | INTRAVENOUS | Status: AC
  Administered 2012-01-16: 01:00:00 via INTRAVENOUS
  Filled 2012-01-15: qty 1000

## 2012-01-15 MED ORDER — ONDANSETRON HCL 4 MG/2ML IJ SOLN
4.0000 mg | Freq: Once | INTRAMUSCULAR | Status: AC
  Administered 2012-01-16: 4 mg via INTRAVENOUS
  Filled 2012-01-15: qty 2

## 2012-01-15 NOTE — ED Notes (Signed)
Presents with pain in left leg and swelling to calf and knee began Thursday. Hx of blood clots. Denies pain in chest or SOB at this time.  CMS intact. Pt currently takes coumadin.

## 2012-01-15 NOTE — ED Provider Notes (Signed)
History     CSN: [id number]  Arrival date & time 01/15/12  2016   First MD Initiated Contact with Patient 01/15/12 2327      Chief Complaint  Patient presents with  . Leg Swelling    (Consider location/radiation/quality/duration/timing/severity/associated sxs/prior treatment) HPI Comments: Patient presents to the emergency department, after 2 days of excruciating pain to her left leg.  That started in the groin, and has radiated down.  There is minimal swelling to the leg.  She was seen at 5 PM and by her Dr. in his walk-in clinic, and was sent to the emergency room because they were unable to obtain a vascular Doppler.  At that time.  Patient is currently taking Coumadin.  She has been therapeutic in the past as a matter of fact 3, weeks, ago.  She was supratherapeutic, and is due for a recheck.  This week.  She denies any fall, trauma to the leg.  She states since, the pain in her leg has, started.  She's had intermittent episodes of chest pain equivalent to her typical angina.  She denies nausea, or shortness of breath.  At this time  The history is provided by the patient.    Past Medical History  Diagnosis Date  . Gout   . Hypercholesterolemia   . Hypertension   . Thyroid disease   . Anemia   . Arthritis   . Constipation   . Chronic kidney disease   . Pulmonary embolism 1969  . Peripheral arterial occlusive disease     s/p thrombectomy right popliteal and tibial arteries and  embolectomy right superficial femoral artery  . Gout   . Lumbar stenosis   . CAD (coronary artery disease) 1972    s/p SEMI  . Fibromyalgia   . Hypothyroidism     Past Surgical History  Procedure Date  . Vena cava umbrella placement secondary to pulmonary embolism 1969 1969  . Tubal ligation   . Appendectomy   . Tonsillectomy     History reviewed. No pertinent family history.  History  Substance Use Topics  . Smoking status: Never Smoker   . Smokeless tobacco: Not on file  . Alcohol  Use: No    OB History    Grav Para Term Preterm Abortions TAB SAB Ect Mult Living                  Review of Systems  Constitutional: Negative for fever and chills.  HENT: Negative.   Respiratory: Negative for shortness of breath.   Cardiovascular: Positive for chest pain and leg swelling.  Gastrointestinal: Negative for nausea.  Musculoskeletal: Positive for gait problem.  Skin: Negative for wound.  Neurological: Negative for weakness and numbness.    Allergies  Celebrex; Codeine; Morphine and related; and Dilaudid  Home Medications   Current Outpatient Rx  Name  Route  Sig  Dispense  Refill  . ALLOPURINOL 100 MG PO TABS   Oral   Take 100 mg by mouth daily.           Marland Kitchen AMLODIPINE BESYLATE 5 MG PO TABS   Oral   Take 5 mg by mouth daily.           Marland Kitchen VITAMIN D 1000 UNITS PO TABS   Oral   Take 1,000 Units by mouth daily.           . FUROSEMIDE 20 MG PO TABS   Oral   Take 40 mg by mouth daily.           Marland Kitchen  LEVOTHYROXINE SODIUM 50 MCG PO TABS   Oral   Take 50 mcg by mouth daily.           Marland Kitchen LOSARTAN POTASSIUM 100 MG PO TABS   Oral   Take 100 mg by mouth daily.           . ADULT MULTIVITAMIN W/MINERALS CH   Oral   Take 1 tablet by mouth daily.           Marland Kitchen NITROGLYCERIN 0.4 MG SL SUBL   Sublingual   Place 0.4 mg under the tongue every 5 (five) minutes as needed. For chest pains          . ONDANSETRON HCL 4 MG PO TABS   Oral   Take 4 mg by mouth every 8 (eight) hours as needed. For nausea          . TRAMADOL HCL 50 MG PO TABS   Oral   Take 100 mg by mouth every 6 (six) hours as needed. For pain          . WARFARIN SODIUM 4 MG PO TABS   Oral   Take 2-4 mg by mouth daily. Take 4mg  every day, except take 2mg  on Mondays           BP 149/75  Pulse 80  Temp 98.2 F (36.8 C) (Oral)  Resp 16  SpO2 95%  Physical Exam  Constitutional: She is oriented to person, place, and time. She appears well-developed and well-nourished.  Eyes:  Pupils are equal, round, and reactive to light.  Cardiovascular: Normal rate.   Pulmonary/Chest: Effort normal. No respiratory distress.  Abdominal: Soft.  Musculoskeletal: She exhibits tenderness.       Legs:      Patient her extremities feel cool to the touch.  They appear to be the same color.  I do not perceive any swelling of either leg.  No edema  Neurological: She is alert and oriented to person, place, and time.  Skin: Skin is warm. There is pallor.    ED Course  Procedures (including critical care time)   Labs Reviewed  CBC  PROTIME-INR  TROPONIN I   No results found.   No diagnosis found.    MDM  Will check labs, INR, x-ray, hip and knee.  This is an atypical presentation for a DVT.  I more concerned for a arterial verses venous compromise.  Will discuss this with Dr. Odelia Gage, NP 01/17/12 4098  Arman Filter, NP 01/17/12 [phone]

## 2012-01-16 ENCOUNTER — Emergency Department (HOSPITAL_COMMUNITY): Payer: Medicare Other

## 2012-01-16 DIAGNOSIS — M79609 Pain in unspecified limb: Secondary | ICD-10-CM

## 2012-01-16 LAB — CBC
HCT: 41.1 % (ref 36.0–46.0)
MCH: 29.7 pg (ref 26.0–34.0)
MCV: 88.4 fL (ref 78.0–100.0)
Platelets: 343 10*3/uL (ref 150–400)
RDW: 14.2 % (ref 11.5–15.5)

## 2012-01-16 LAB — POCT I-STAT, CHEM 8
BUN: 30 mg/dL — ABNORMAL HIGH (ref 6–23)
Calcium, Ion: 1.22 mmol/L (ref 1.13–1.30)
Chloride: 107 mEq/L (ref 96–112)
Creatinine, Ser: 1.4 mg/dL — ABNORMAL HIGH (ref 0.50–1.10)
TCO2: 23 mmol/L (ref 0–100)

## 2012-01-16 MED ORDER — MORPHINE SULFATE 4 MG/ML IJ SOLN
4.0000 mg | Freq: Once | INTRAMUSCULAR | Status: AC
  Administered 2012-01-16: 4 mg via INTRAVENOUS
  Filled 2012-01-16: qty 1

## 2012-01-16 MED ORDER — HYDROCODONE-ACETAMINOPHEN 5-325 MG PO TABS: ORAL_TABLET | ORAL | Status: DC

## 2012-01-16 MED ORDER — SODIUM CHLORIDE 0.9 % IV BOLUS (SEPSIS)
500.0000 mL | Freq: Once | INTRAVENOUS | Status: AC
  Administered 2012-01-16: 500 mL via INTRAVENOUS

## 2012-01-16 MED ORDER — ONDANSETRON 8 MG PO TBDP: 8.0000 mg | ORAL_TABLET | Freq: Three times a day (TID) | ORAL | Status: DC | PRN

## 2012-01-16 NOTE — ED Notes (Signed)
Lunch tray ordered for pt.

## 2012-01-16 NOTE — Progress Notes (Signed)
VASCULAR LAB PRELIMINARY  PRELIMINARY  PRELIMINARY  PRELIMINARY  Left lower extremity venous and arterial duplex completed.    Preliminary report:  Left:  No evidence of DVT, superficial thrombosis, or Baker's cyst.  No evidence of arterial thrombus or occlusion.  Waveforms biphasic throughout.  Artice Holohan, RVT 01/16/2012, 9:27 AM

## 2012-01-16 NOTE — ED Notes (Signed)
Pt. Ambulated to restroom with walker .but use the steady back to room.

## 2012-01-16 NOTE — ED Notes (Signed)
Pt transported to vascular for doppler studies.

## 2012-01-16 NOTE — ED Notes (Signed)
Pt ambulated with assistance and walker. Able to bear weight bilaterally, but states pain to L calf area, rating 6/10 at the time. PA at bedside.

## 2012-01-16 NOTE — ED Provider Notes (Signed)
Medical screening examination/treatment/procedure(s) were performed by non-physician practitioner and as supervising physician I was immediately available for consultation/collaboration.   Endiya Klahr, MD 01/16/12 1544 

## 2012-01-16 NOTE — ED Notes (Signed)
Patient is alert and orientedx4.  Patient was explained discharge instructions and she understood them with no questions.  Patient's son, Gene Bellevue is transporting the patient home.

## 2012-01-16 NOTE — ED Provider Notes (Signed)
8:04 AM Handoff from Dr. Hyacinth Meeker.   Patient with L leg pain and swelling for several days. Has history of arterial and vascular thrombus. She is on coumadin (INR 2.04).   CT of femur overnight does not show infection. ABI overnight was 1.0 per Dr. Hyacinth Meeker.   She is pending doppler arterial and venous US this AM.   Patient seen and examined. States her pain is better than yesterday. States that her toes were pale white yesterday. She lives with her daughter who is gone several hours out of the day.   Exam:  Gen NAD; Heart RRR, nml S1,S2, no m/r/g; Lungs CTAB; Abd soft, NT, no rebound or guarding; Ext tender to palpation left medial thigh and calf.   10:44 AM Doppler studies are negative. D/w Dr. Ranae Palms. Will attempt to assess ability to ambulate. Patient typically walks short distances at home without assistance.   11:00 AM Patient did fairly well with ambulation with a walker. She is up in the hallway to the bathroom going slowly. Will speak with family. Patient can likely be d/c with adequate supervision, walker, pain medication, PCP follow-up.   12:41 PM I spoke with daughter and reviewed results. She states she is comfortable caring for mother at home. She has a walker at home that she can use. Discussed that patient will be on pain medication that will require supervision. She does take tramadol currently. They have PCP appointment already scheduled for two days. Patient's son will be in to pick patient up this afternoon.   I also spoke with son who will come to the ED after 3pm to pick up patient.  Discussed use of pain medication and fall risk. Urged lowest dose and only taking under supervision.   3:32 PM Katrinka Blazing NP aware of patient. Awaiting transportation home.   Renne Crigler, [place] 01/16/12 [phone]

## 2012-01-16 NOTE — ED Provider Notes (Addendum)
78 year old female with a history of the arterial thrombotic disease requiring thrombectomy in her right lower extremity in the past who presents with gradual onset of persistent, gradually worsening, severe left lower extremity pain between the hip and the knee especially over the medial surface of the leg. She denies any pain below the knee and denies any injuries.  On exam the patient has clear heart and lungs, soft abdomen, mild asymmetry with swelling of the thigh on the left but no crepitance or changes in the skin color. She is afebrile, not tachycardic and has no signs of lack of perfusion to the foot. Her capillary refill is 2-3 seconds on the left lower extremities, her pulses are weak but easily dopplerable and her ABIs are normal on the left. I have done a quick bedside venous compressibility study and it appears that both the common femoral vein and the popliteal vein are somewhat compressible.  Doppler flow was not measured, she does not have an acute arterial occlusion. Plain films of the hip and the knee did not appear to have any fractures, CT scan of the thigh was performed to further evaluate possible fasciitis. The patient does not appear toxic and will need official vascular studies of the arteries and veins in the morning.  ED ECG REPORT  I personally interpreted this EKG   Date: 01/16/2012   Rate: 81  Rhythm: normal sinus rhythm  QRS Axis: left  Intervals: normal  ST/T Wave abnormalities: nonspecific T wave changes  Conduction Disutrbances:right bundle branch block  Narrative Interpretation:   Old EKG Reviewed: Compared with 02/17/2011, no significant changes are seen   Medical screening examination/treatment/procedure(s) were conducted as a shared visit with non-physician practitioner(s) and myself.  I personally evaluated the patient during the encounter    Vida Roller, MD 01/16/12 [id number]  Vida Roller, MD 01/16/12 [phone]

## 2012-01-16 NOTE — ED Notes (Addendum)
Patient is resting comfortably, she ate about 75% of her meal.

## 2012-01-20 NOTE — ED Provider Notes (Signed)
Medical screening examination/treatment/procedure(s) were performed by non-physician practitioner and as supervising physician I was immediately available for consultation/collaboration.  Sunnie Nielsen, MD 01/20/12 [phone]

## 2012-04-09 ENCOUNTER — Encounter (HOSPITAL_COMMUNITY): Payer: Self-pay | Admitting: Nurse Practitioner

## 2012-04-09 ENCOUNTER — Emergency Department (HOSPITAL_COMMUNITY)
Admission: EM | Admit: 2012-04-09 | Discharge: 2012-04-09 | Discharge status: Against Medical Advice | Payer: Medicare Other | Attending: Emergency Medicine | Admitting: Emergency Medicine

## 2012-04-09 ENCOUNTER — Emergency Department (HOSPITAL_COMMUNITY): Payer: Medicare Other

## 2012-04-09 DIAGNOSIS — F810 Specific reading disorder: Secondary | ICD-10-CM | POA: Insufficient documentation

## 2012-04-09 DIAGNOSIS — R51 Headache: Secondary | ICD-10-CM | POA: Insufficient documentation

## 2012-04-09 DIAGNOSIS — R5381 Other malaise: Secondary | ICD-10-CM | POA: Insufficient documentation

## 2012-04-09 DIAGNOSIS — N189 Chronic kidney disease, unspecified: Secondary | ICD-10-CM | POA: Insufficient documentation

## 2012-04-09 DIAGNOSIS — R5383 Other fatigue: Secondary | ICD-10-CM | POA: Insufficient documentation

## 2012-04-09 DIAGNOSIS — I129 Hypertensive chronic kidney disease with stage 1 through stage 4 chronic kidney disease, or unspecified chronic kidney disease: Secondary | ICD-10-CM | POA: Insufficient documentation

## 2012-04-09 LAB — CBC
HCT: 41.4 % (ref 36.0–46.0)
MCH: 30.3 pg (ref 26.0–34.0)
MCV: 89.6 fL (ref 78.0–100.0)
Platelets: 361 10*3/uL (ref 150–400)
RBC: 4.62 MIL/uL (ref 3.87–5.11)
WBC: 9.9 10*3/uL (ref 4.0–10.5)

## 2012-04-09 LAB — URINALYSIS, ROUTINE W REFLEX MICROSCOPIC
Bilirubin Urine: NEGATIVE
Glucose, UA: NEGATIVE mg/dL
Hgb urine dipstick: NEGATIVE
Ketones, ur: NEGATIVE mg/dL
Protein, ur: NEGATIVE mg/dL
pH: 5 (ref 5.0–8.0)

## 2012-04-09 LAB — BASIC METABOLIC PANEL
BUN: 31 mg/dL — ABNORMAL HIGH (ref 6–23)
CO2: 23 mEq/L (ref 19–32)
Calcium: 10.1 mg/dL (ref 8.4–10.5)
Chloride: 98 mEq/L (ref 96–112)
Creatinine, Ser: 1.53 mg/dL — ABNORMAL HIGH (ref 0.50–1.10)

## 2012-04-09 NOTE — ED Notes (Signed)
Pt c/o weakness since February, increasingly worse since onset but much worse today. C/o headaches, difficulty reading "all the letters move around when I try to read them," and L side weakness. PCP ross sent pt for further eval after an office visit today. A&Ox4, speech is clear, L grip weaker than R.

## 2012-04-09 NOTE — ED Notes (Addendum)
Pt family very agitated with this RN, states that pt is nauseated. This RN offered to get pt medication for nausea, pt states "I just want to check out." Pt daughter states "We will just go home and call an ambulance." Pt and family informed of risks of leaving without being seen by MD.

## 2013-07-09 ENCOUNTER — Emergency Department (HOSPITAL_COMMUNITY): Payer: Medicare Other

## 2013-07-09 ENCOUNTER — Observation Stay (HOSPITAL_COMMUNITY)
Admission: EM | Admit: 2013-07-09 | Discharge: 2013-07-10 | Disposition: A | Discharge status: Home / Self Care | Payer: Medicare Other | Attending: Internal Medicine | Admitting: Internal Medicine

## 2013-07-09 ENCOUNTER — Encounter (HOSPITAL_COMMUNITY): Payer: Self-pay | Admitting: Emergency Medicine

## 2013-07-09 DIAGNOSIS — R079 Chest pain, unspecified: Secondary | ICD-10-CM

## 2013-07-09 DIAGNOSIS — I10 Essential (primary) hypertension: Secondary | ICD-10-CM

## 2013-07-09 DIAGNOSIS — I129 Hypertensive chronic kidney disease with stage 1 through stage 4 chronic kidney disease, or unspecified chronic kidney disease: Secondary | ICD-10-CM | POA: Insufficient documentation

## 2013-07-09 DIAGNOSIS — R110 Nausea: Secondary | ICD-10-CM

## 2013-07-09 DIAGNOSIS — R0602 Shortness of breath: Secondary | ICD-10-CM

## 2013-07-09 DIAGNOSIS — Z888 Allergy status to other drugs, medicaments and biological substances status: Secondary | ICD-10-CM | POA: Insufficient documentation

## 2013-07-09 DIAGNOSIS — Z862 Personal history of diseases of the blood and blood-forming organs and certain disorders involving the immune mechanism: Secondary | ICD-10-CM | POA: Insufficient documentation

## 2013-07-09 DIAGNOSIS — R5383 Other fatigue: Secondary | ICD-10-CM

## 2013-07-09 DIAGNOSIS — R197 Diarrhea, unspecified: Secondary | ICD-10-CM

## 2013-07-09 DIAGNOSIS — E7800 Pure hypercholesterolemia, unspecified: Secondary | ICD-10-CM

## 2013-07-09 DIAGNOSIS — Z86711 Personal history of pulmonary embolism: Secondary | ICD-10-CM | POA: Insufficient documentation

## 2013-07-09 DIAGNOSIS — Z8719 Personal history of other diseases of the digestive system: Secondary | ICD-10-CM | POA: Insufficient documentation

## 2013-07-09 DIAGNOSIS — N189 Chronic kidney disease, unspecified: Secondary | ICD-10-CM

## 2013-07-09 DIAGNOSIS — R1011 Right upper quadrant pain: Secondary | ICD-10-CM

## 2013-07-09 DIAGNOSIS — R0989 Other specified symptoms and signs involving the circulatory and respiratory systems: Secondary | ICD-10-CM

## 2013-07-09 DIAGNOSIS — R0609 Other forms of dyspnea: Secondary | ICD-10-CM | POA: Diagnosis present

## 2013-07-09 DIAGNOSIS — Z7901 Long term (current) use of anticoagulants: Secondary | ICD-10-CM | POA: Insufficient documentation

## 2013-07-09 DIAGNOSIS — M109 Gout, unspecified: Secondary | ICD-10-CM | POA: Insufficient documentation

## 2013-07-09 DIAGNOSIS — R5381 Other malaise: Secondary | ICD-10-CM | POA: Insufficient documentation

## 2013-07-09 DIAGNOSIS — I2510 Atherosclerotic heart disease of native coronary artery without angina pectoris: Secondary | ICD-10-CM | POA: Insufficient documentation

## 2013-07-09 DIAGNOSIS — Z885 Allergy status to narcotic agent status: Secondary | ICD-10-CM | POA: Insufficient documentation

## 2013-07-09 DIAGNOSIS — R0789 Other chest pain: Principal | ICD-10-CM | POA: Insufficient documentation

## 2013-07-09 DIAGNOSIS — K529 Noninfective gastroenteritis and colitis, unspecified: Secondary | ICD-10-CM

## 2013-07-09 DIAGNOSIS — D649 Anemia, unspecified: Secondary | ICD-10-CM

## 2013-07-09 DIAGNOSIS — Z79899 Other long term (current) drug therapy: Secondary | ICD-10-CM | POA: Insufficient documentation

## 2013-07-09 DIAGNOSIS — Z8739 Personal history of other diseases of the musculoskeletal system and connective tissue: Secondary | ICD-10-CM | POA: Insufficient documentation

## 2013-07-09 DIAGNOSIS — E039 Hypothyroidism, unspecified: Secondary | ICD-10-CM | POA: Insufficient documentation

## 2013-07-09 HISTORY — DX: Transient cerebral ischemic attack, unspecified: G45.9

## 2013-07-09 HISTORY — DX: Shortness of breath: R06.02

## 2013-07-09 HISTORY — DX: Acute myocardial infarction, unspecified: I21.9

## 2013-07-09 HISTORY — DX: Angina pectoris, unspecified: I20.9

## 2013-07-09 LAB — CBC WITH DIFFERENTIAL/PLATELET
BASOS ABS: 0 10*3/uL (ref 0.0–0.1)
BASOS PCT: 0 % (ref 0–1)
EOS ABS: 0 10*3/uL (ref 0.0–0.7)
Eosinophils Relative: 0 % (ref 0–5)
HCT: 40.3 % (ref 36.0–46.0)
Hemoglobin: 13.7 g/dL (ref 12.0–15.0)
Lymphocytes Relative: 20 % (ref 12–46)
Lymphs Abs: 1.9 10*3/uL (ref 0.7–4.0)
MCH: 30.1 pg (ref 26.0–34.0)
MCHC: 34 g/dL (ref 30.0–36.0)
MCV: 88.6 fL (ref 78.0–100.0)
MONOS PCT: 7 % (ref 3–12)
Monocytes Absolute: 0.6 10*3/uL (ref 0.1–1.0)
NEUTROS ABS: 7.1 10*3/uL (ref 1.7–7.7)
NEUTROS PCT: 73 % (ref 43–77)
Platelets: 363 10*3/uL (ref 150–400)
RBC: 4.55 MIL/uL (ref 3.87–5.11)
RDW: 14.1 % (ref 11.5–15.5)
WBC: 9.6 10*3/uL (ref 4.0–10.5)

## 2013-07-09 LAB — I-STAT TROPONIN, ED: TROPONIN I, POC: 0 ng/mL (ref 0.00–0.08)

## 2013-07-09 LAB — URINALYSIS, ROUTINE W REFLEX MICROSCOPIC
BILIRUBIN URINE: NEGATIVE
GLUCOSE, UA: NEGATIVE mg/dL
HGB URINE DIPSTICK: NEGATIVE
Ketones, ur: NEGATIVE mg/dL
Leukocytes, UA: NEGATIVE
Nitrite: NEGATIVE
Protein, ur: NEGATIVE mg/dL
SPECIFIC GRAVITY, URINE: 1.013 (ref 1.005–1.030)
Urobilinogen, UA: 0.2 mg/dL (ref 0.0–1.0)
pH: 8 (ref 5.0–8.0)

## 2013-07-09 LAB — BASIC METABOLIC PANEL
BUN: 23 mg/dL (ref 6–23)
CHLORIDE: 98 meq/L (ref 96–112)
CO2: 23 mEq/L (ref 19–32)
CREATININE: 1.18 mg/dL — AB (ref 0.50–1.10)
Calcium: 11.1 mg/dL — ABNORMAL HIGH (ref 8.4–10.5)
GFR, EST AFRICAN AMERICAN: 45 mL/min — AB (ref >90)
GFR, EST NON AFRICAN AMERICAN: 39 mL/min — AB (ref >90)
Glucose, Bld: 107 mg/dL — ABNORMAL HIGH (ref 70–99)
POTASSIUM: 3.7 meq/L (ref 3.7–5.3)
Sodium: 140 mEq/L (ref 137–147)

## 2013-07-09 LAB — TROPONIN I: Troponin I: <0.3 ng/mL (ref <0.30)

## 2013-07-09 LAB — D-DIMER, QUANTITATIVE (NOT AT ARMC): D DIMER QUANT: 0.36 ug{FEU}/mL (ref 0.00–0.48)

## 2013-07-09 LAB — PRO B NATRIURETIC PEPTIDE: Pro B Natriuretic peptide (BNP): 378.5 pg/mL (ref 0–450)

## 2013-07-09 LAB — PROTIME-INR
INR: 1.54 — ABNORMAL HIGH (ref 0.00–1.49)
PROTHROMBIN TIME: 18.1 s — AB (ref 11.6–15.2)

## 2013-07-09 MED ORDER — ONDANSETRON HCL 4 MG/2ML IJ SOLN
4.0000 mg | Freq: Once | INTRAMUSCULAR | Status: AC
  Administered 2013-07-09: 4 mg via INTRAVENOUS
  Filled 2013-07-09: qty 2

## 2013-07-09 MED ORDER — SODIUM CHLORIDE 0.9 % IV SOLN
INTRAVENOUS | Status: AC
  Administered 2013-07-09 – 2013-07-10 (×2): via INTRAVENOUS

## 2013-07-09 MED ORDER — ONDANSETRON HCL 4 MG/2ML IJ SOLN
4.0000 mg | Freq: Four times a day (QID) | INTRAMUSCULAR | Status: DC | PRN
  Administered 2013-07-09: 4 mg via INTRAVENOUS
  Filled 2013-07-09: qty 2

## 2013-07-09 MED ORDER — LEVOTHYROXINE SODIUM 50 MCG PO TABS
50.0000 ug | ORAL_TABLET | Freq: Every day | ORAL | Status: DC
  Administered 2013-07-10: 50 ug via ORAL
  Filled 2013-07-09 (×2): qty 1

## 2013-07-09 MED ORDER — TRAMADOL HCL 50 MG PO TABS
100.0000 mg | ORAL_TABLET | Freq: Two times a day (BID) | ORAL | Status: DC
  Administered 2013-07-09 – 2013-07-10 (×2): 100 mg via ORAL
  Filled 2013-07-09 (×2): qty 2

## 2013-07-09 MED ORDER — RANITIDINE HCL 150 MG/10ML PO SYRP
150.0000 mg | ORAL_SOLUTION | Freq: Once | ORAL | Status: AC
  Administered 2013-07-09: 150 mg via ORAL
  Filled 2013-07-09: qty 10

## 2013-07-09 MED ORDER — ADULT MULTIVITAMIN W/MINERALS CH
1.0000 | ORAL_TABLET | Freq: Every day | ORAL | Status: DC
  Administered 2013-07-10: 1 via ORAL
  Filled 2013-07-09: qty 1

## 2013-07-09 MED ORDER — WARFARIN SODIUM 5 MG PO TABS
5.0000 mg | ORAL_TABLET | Freq: Once | ORAL | Status: AC
  Administered 2013-07-09: 5 mg via ORAL
  Filled 2013-07-09: qty 1

## 2013-07-09 MED ORDER — CALCIUM CARBONATE ANTACID 500 MG PO CHEW
2.0000 | CHEWABLE_TABLET | Freq: Two times a day (BID) | ORAL | Status: DC | PRN
  Filled 2013-07-09 (×2): qty 2

## 2013-07-09 MED ORDER — ACETAMINOPHEN 500 MG PO TABS
500.0000 mg | ORAL_TABLET | Freq: Four times a day (QID) | ORAL | Status: DC | PRN
  Administered 2013-07-09: 500 mg via ORAL
  Filled 2013-07-09: qty 1

## 2013-07-09 MED ORDER — LOSARTAN POTASSIUM 50 MG PO TABS
100.0000 mg | ORAL_TABLET | Freq: Every day | ORAL | Status: DC
  Administered 2013-07-10: 100 mg via ORAL
  Filled 2013-07-09: qty 2

## 2013-07-09 MED ORDER — ALLOPURINOL 100 MG PO TABS
100.0000 mg | ORAL_TABLET | Freq: Every day | ORAL | Status: DC
  Administered 2013-07-10: 100 mg via ORAL
  Filled 2013-07-09: qty 1

## 2013-07-09 MED ORDER — FAMOTIDINE 20 MG PO TABS
20.0000 mg | ORAL_TABLET | Freq: Every day | ORAL | Status: DC
  Administered 2013-07-10: 20 mg via ORAL
  Filled 2013-07-09: qty 1

## 2013-07-09 MED ORDER — ASPIRIN 81 MG PO CHEW
81.0000 mg | CHEWABLE_TABLET | Freq: Every day | ORAL | Status: DC
  Administered 2013-07-09 – 2013-07-10 (×2): 81 mg via ORAL
  Filled 2013-07-09 (×2): qty 1

## 2013-07-09 MED ORDER — NITROGLYCERIN 0.4 MG SL SUBL: 0.4000 mg | SUBLINGUAL_TABLET | SUBLINGUAL | Status: DC | PRN

## 2013-07-09 MED ORDER — VITAMIN D3 25 MCG (1000 UNIT) PO TABS
1000.0000 [IU] | ORAL_TABLET | Freq: Every day | ORAL | Status: DC
  Administered 2013-07-10: 1000 [IU] via ORAL
  Filled 2013-07-09: qty 1

## 2013-07-09 MED ORDER — AMLODIPINE BESYLATE 5 MG PO TABS
5.0000 mg | ORAL_TABLET | Freq: Every day | ORAL | Status: DC
  Administered 2013-07-10: 5 mg via ORAL
  Filled 2013-07-09: qty 1

## 2013-07-09 MED ORDER — WARFARIN - PHARMACIST DOSING INPATIENT: Freq: Every day | Status: DC

## 2013-07-09 NOTE — ED Notes (Signed)
Took pts temperature per Dr request.

## 2013-07-09 NOTE — Progress Notes (Addendum)
ANTICOAGULATION CONSULT NOTE - Initial Consult  Pharmacy Consult for coumadin Indication: hx of PE  Allergies  Allergen Reactions  . Celebrex [Celecoxib] Diarrhea  . Demerol [Meperidine] Nausea And Vomiting  . Morphine And Related Nausea Only    CAN TAKE WITH PHENERGAN  . Codeine Nausea Only and Rash  . Dilaudid [Hydromorphone Hcl] Rash    Patient Measurements: Height: 5\' 4"  (162.6 cm) Weight: 142 lb (64.411 kg) IBW/kg (Calculated) : 54.7 Heparin Dosing Weight:   Vital Signs: Temp: 98.4 F (36.9 C) (06/10 1736) Temp src: Oral (06/10 1736) BP: 166/63 mmHg (06/10 1736) Pulse Rate: 101 (06/10 1736)  Labs:  Recent Labs  07/09/13 1434  HGB 13.7  HCT 40.3  PLT 363  LABPROT 18.1*  INR 1.54*  CREATININE 1.18*    Estimated Creatinine Clearance: 26.3 ml/min (by C-G formula based on Cr of 1.18).   Medical History: Past Medical History  Diagnosis Date  . Gout   . Hypercholesterolemia   . Hypertension   . Thyroid disease   . Anemia   . Arthritis   . Constipation   . Chronic kidney disease   . Pulmonary embolism 1969  . Peripheral arterial occlusive disease     s/p thrombectomy right popliteal and tibial arteries and  embolectomy right superficial femoral artery  . Gout   . Lumbar stenosis   . CAD (coronary artery disease) 1972    s/p SEMI  . Fibromyalgia   . Hypothyroidism     Medications:  Scheduled:  . allopurinol  100 mg Oral Daily  . amLODipine  5 mg Oral Daily  . aspirin  81 mg Oral Daily  . cholecalciferol  1,000 Units Oral Daily  . famotidine  20 mg Oral Daily  . levothyroxine  50 mcg Oral Daily  . [START ON 07/10/2013] losartan  100 mg Oral Daily  . multivitamin with minerals  1 tablet Oral Daily  . traMADol  100 mg Oral BID   Infusions:  . sodium chloride      Assessment: 80 yo female with hx of PE will be continued on home coumadin.  INR today is 1.54. Home coumadin regimen was 4mg  daily except 5 mg on Tuesdays.  Last dose was on  07/08/13.  On allopurinol which she was on at home.  Goal of Therapy:  INR 2-3 Monitor platelets by anticoagulation protocol: Yes   Plan:  1) Coumadin 5 mg po x1 2) Daily PT/INR  Jewel Venditto, [place] 07/09/2013,6:45 PM

## 2013-07-09 NOTE — ED Notes (Signed)
Attempted to call report

## 2013-07-09 NOTE — ED Provider Notes (Signed)
Medical screening examination/treatment/procedure(s) were conducted as a shared visit with non-physician practitioner(s) and myself.  I personally evaluated the patient during the encounter.   EKG Interpretation   Date/Time:  Wednesday July 09 2013 13:29:18 EDT Ventricular Rate:  92 PR Interval:  144 QRS Duration: 120 QT Interval:  380 QTC Calculation: 470 R Axis:   -76 Text Interpretation:  Sinus rhythm IVCD, consider atypical RBBB Left  ventricular hypertrophy Inferior infarct, old Anterior Q waves, possibly  due to LVH No significant change was found Confirmed by Konstantina Nachreiner  MD, Emerald Gehres  ([id number]) on 07/09/2013 4:10:38 PM      Overall well-appearing.  Symptoms may represent more esophageal spasm/GERD.  Patient has had 2 negative heart caths in the late 90s.  No recent heart catheterization.  Suspicion for ACS is lower.  Could represent PE given her subtherapeutic INR, d-dimer will be sent.  Hospitalist admit for observation overnight and ongoing evaluation of the patient's chest discomfort.  Currently resolved.  Lyanne Co, MD 07/09/13 [phone]

## 2013-07-09 NOTE — ED Provider Notes (Signed)
CSN: [id number]     Arrival date & time 07/09/13  1308 History   First MD Initiated Contact with Patient 07/09/13 1322     Chief Complaint  Patient presents with  . Shortness of Breath     (Consider location/radiation/quality/duration/timing/severity/associated sxs/prior Treatment) HPI Comments: Patient presents to the emergency department with chief complaint of shortness of breath. She states the symptoms started last night, and then this morning she had sudden onset of chest pressure. She states that the chest pressure radiates to double shoulders and to the back. She also states that she has been short of breath and weak for the past couple of days. She endorses associated nausea, no vomiting. She reports having diarrhea approximately 10 times yesterday. She has not had any today. She is given 4 of nitroglycerin and aspirin prior to arrival. She states that she is feeling better than she was. She has a history of CAD and PE. She is currently anticoagulated with Coumadin.  The history is provided by the patient. No language interpreter was used.    Past Medical History  Diagnosis Date  . Gout   . Hypercholesterolemia   . Hypertension   . Thyroid disease   . Anemia   . Arthritis   . Constipation   . Chronic kidney disease   . Pulmonary embolism 1969  . Peripheral arterial occlusive disease     s/p thrombectomy right popliteal and tibial arteries and  embolectomy right superficial femoral artery  . Gout   . Lumbar stenosis   . CAD (coronary artery disease) 1972    s/p SEMI  . Fibromyalgia   . Hypothyroidism    Past Surgical History  Procedure Laterality Date  . Vena cava umbrella placement secondary to pulmonary embolism 1969  1969  . Tubal ligation    . Appendectomy    . Tonsillectomy     No family history on file. History  Substance Use Topics  . Smoking status: Never Smoker   . Smokeless tobacco: Not on file  . Alcohol Use: No   OB History   Grav Para Term  Preterm Abortions TAB SAB Ect Mult Living                 Review of Systems  Respiratory: Positive for chest tightness and shortness of breath.   Cardiovascular: Positive for chest pain.  Gastrointestinal: Positive for nausea and diarrhea.  Neurological: Positive for weakness.  All other systems reviewed and are negative.     Allergies  Celebrex; Codeine; Morphine and related; and Dilaudid  Home Medications   Prior to Admission medications   Medication Sig Start Date End Date Taking? Authorizing Provider  acetaminophen (TYLENOL) 500 MG tablet Take 500-1,000 mg by mouth every 6 (six) hours as needed for pain.    Historical Provider, MD  allopurinol (ZYLOPRIM) 100 MG tablet Take 100 mg by mouth daily.      Historical Provider, MD  amLODipine (NORVASC) 5 MG tablet Take 5 mg by mouth daily.      Historical Provider, MD  cholecalciferol (VITAMIN D) 1000 UNITS tablet Take 1,000 Units by mouth daily.      Historical Provider, MD  furosemide (LASIX) 20 MG tablet Take 20 mg by mouth daily.     Historical Provider, MD  levothyroxine (SYNTHROID, LEVOTHROID) 50 MCG tablet Take 50 mcg by mouth daily.      Historical Provider, MD  losartan (COZAAR) 100 MG tablet Take 100 mg by mouth daily.  Historical Provider, MD  Multiple Vitamin (MULITIVITAMIN WITH MINERALS) TABS Take 1 tablet by mouth daily.      Historical Provider, MD  nitroGLYCERIN (NITROSTAT) 0.4 MG SL tablet Place 0.4 mg under the tongue every 5 (five) minutes as needed for chest pain.     Historical Provider, MD  omeprazole (PRILOSEC) 20 MG capsule Take 20 mg by mouth daily.    Historical Provider, MD  ondansetron (ZOFRAN-ODT) 8 MG disintegrating tablet Take 8 mg by mouth every 8 (eight) hours as needed for nausea. 01/16/12   Renne CriglerJoshua Geiple, PA-C  traMADol (ULTRAM) 50 MG tablet Take 100 mg by mouth every 6 (six) hours as needed for pain.     Historical Provider, MD  warfarin (COUMADIN) 4 MG tablet Take 4 mg by mouth every evening.      Historical Provider, MD   BP 151/69  Pulse 88  Resp 24  SpO2 100% Physical Exam  Nursing note and vitals reviewed. Constitutional: She is oriented to person, place, and time. She appears well-developed and well-nourished.  HENT:  Head: Normocephalic and atraumatic.  Eyes: Conjunctivae and EOM are normal. Pupils are equal, round, and reactive to light.  Neck: Normal range of motion. Neck supple.  Cardiovascular: Normal rate and regular rhythm.  Exam reveals no gallop and no friction rub.   No murmur heard. Pulmonary/Chest: Effort normal and breath sounds normal. No respiratory distress. She has no wheezes. She has no rales. She exhibits no tenderness.  Speaks in complete sentences, no wheezes  Abdominal: Soft. Bowel sounds are normal. She exhibits no distension and no mass. There is no tenderness. There is no rebound and no guarding.  No focal abdominal tenderness, no RLQ tenderness or pain at McBurney's point, no RUQ tenderness or Murphy's sign, no left-sided abdominal tenderness, no fluid wave, or signs of peritonitis   Musculoskeletal: Normal range of motion. She exhibits no edema and no tenderness.  Neurological: She is alert and oriented to person, place, and time.  Skin: Skin is warm and dry.  Psychiatric: She has a normal mood and affect. Her behavior is normal. Judgment and thought content normal.    ED Course  Procedures (including critical care time) Results for orders placed during the hospital encounter of 07/09/13  CBC WITH DIFFERENTIAL      Result Value Ref Range   WBC 9.6  4.0 - 10.5 K/uL   RBC 4.55  3.87 - 5.11 MIL/uL   Hemoglobin 13.7  12.0 - 15.0 g/dL   HCT 16.140.3  09.636.0 - 04.546.0 %   MCV 88.6  78.0 - 100.0 fL   MCH 30.1  26.0 - 34.0 pg   MCHC 34.0  30.0 - 36.0 g/dL   RDW 40.914.1  81.111.5 - 91.415.5 %   Platelets 363  150 - 400 K/uL   Neutrophils Relative % 73  43 - 77 %   Neutro Abs 7.1  1.7 - 7.7 K/uL   Lymphocytes Relative 20  12 - 46 %   Lymphs Abs 1.9  0.7 - 4.0 K/uL    Monocytes Relative 7  3 - 12 %   Monocytes Absolute 0.6  0.1 - 1.0 K/uL   Eosinophils Relative 0  0 - 5 %   Eosinophils Absolute 0.0  0.0 - 0.7 K/uL   Basophils Relative 0  0 - 1 %   Basophils Absolute 0.0  0.0 - 0.1 K/uL  BASIC METABOLIC PANEL      Result Value Ref Range   Sodium 140  137 - 147 mEq/L   Potassium 3.7  3.7 - 5.3 mEq/L   Chloride 98  96 - 112 mEq/L   CO2 23  19 - 32 mEq/L   Glucose, Bld 107 (*) 70 - 99 mg/dL   BUN 23  6 - 23 mg/dL   Creatinine, Ser 1.61 (*) 0.50 - 1.10 mg/dL   Calcium 09.6 (*) 8.4 - 10.5 mg/dL   GFR calc non Af Amer 39 (*) >90 mL/min   GFR calc Af Amer 45 (*) >90 mL/min  PROTIME-INR      Result Value Ref Range   Prothrombin Time 18.1 (*) 11.6 - 15.2 seconds   INR 1.54 (*) 0.00 - 1.49  PRO B NATRIURETIC PEPTIDE      Result Value Ref Range   Pro B Natriuretic peptide (BNP) 378.5  0 - 450 pg/mL  URINALYSIS, ROUTINE W REFLEX MICROSCOPIC      Result Value Ref Range   Color, Urine YELLOW  YELLOW   APPearance CLEAR  CLEAR   Specific Gravity, Urine 1.013  1.005 - 1.030   pH 8.0  5.0 - 8.0   Glucose, UA NEGATIVE  NEGATIVE mg/dL   Hgb urine dipstick NEGATIVE  NEGATIVE   Bilirubin Urine NEGATIVE  NEGATIVE   Ketones, ur NEGATIVE  NEGATIVE mg/dL   Protein, ur NEGATIVE  NEGATIVE mg/dL   Urobilinogen, UA 0.2  0.0 - 1.0 mg/dL   Nitrite NEGATIVE  NEGATIVE   Leukocytes, UA NEGATIVE  NEGATIVE  I-STAT TROPOININ, ED      Result Value Ref Range   Troponin i, poc 0.00  0.00 - 0.08 ng/mL   Comment 3            Dg Chest 2 View  07/09/2013   CLINICAL DATA:  Chest pain and shortness of breath.  EXAM: CHEST  2 VIEW  COMPARISON:  01/16/2012.  FINDINGS: Surgical clips noted in the right axilla. Previous cholecystectomy. The heart size and mediastinal contours are within normal limits. Both lungs are clear. Scoliosis deformity involving the thoracic spine is convex towards the right. There is multi level disc space narrowing with ventral endplate spurring.   IMPRESSION: 1. No acute findings.   Electronically Signed   By: Signa Kell M.D.   On: 07/09/2013 15:05     No results found.   EKG Interpretation None      MDM   Final diagnoses:  Chest pain  SOB (shortness of breath)   Patient with chest tightness and shortness of breath. The shortness rest are last night, chest tightness began this morning. Check basic labs, and will reassess.  Initial labs are reassuring, troponin is negative, and EKG is unchanged, no evidence of infection on chest x-ray. However, given the patient's risk factors and presentation, recommends admission for cardiac rule out. Discussed this with Dr. Patria Mane, who agrees with the plan.  Patient seen by and discussed with Dr. Patria Mane, who agrees with admission.   Roxy Horseman, PA-C 07/09/13 [phone]

## 2013-07-09 NOTE — ED Notes (Signed)
EMS - Pt coming from home with c/o of SOB that started last night and sudden onset of chest pressure that started today.  The chest pressure radiates to both shoulders and in to the back.  Pt has SOB, weakness, diarrhea >10 times since yesterday.  Pt had received 4 Nitro, 324mg  Aspirin and 4mg  Zofran.

## 2013-07-09 NOTE — H&P (Signed)
[facility] History and Physical  Katherine SavageJetty W Pardoe ZOX:[id number] DOB: 28-Nov-1919 DOA: 07/09/2013  Referring physician: EDP PCP:  Duane Lopeoss, Alan, MD   Chief Complaint: Chest tightness and DOE   HPI: Katherine Cuevas is a 80 y.o. female with history of hypertension, CAD status post MI in the past, peripheral arterial occlusive disease, hypercholesterolemia, CKD who presents with above complaints. She relates that she began having diarrhea last PM -multiple nonbloody stools throughout this a.m, with associated nausea but no vomiting. She states she has not had any further diarrhea since coming to the ED, and no sick contacts. This morning she states that about 11:30 she began having chest tightness-described as severe, associated with dyspnea and nonradiating. Her daughter in law states that the chest tightness seemed to have started after she drank some Pepsi. She was seen in the ED and chest x-ray showed no acute findings, EKG showed normal sinus rhythm at 92, LVH with no acute ischemic changes, troponin negative, UA within normal limits, d-dimer normal at 0.36, and BNP unremarkable at 378.5. She is admitted for further evaluation and management.   Review of Systems The patient denies anorexia, fever, weight loss,, vision loss, decreased hearing, hoarseness, chest pain, syncope, dyspnea on exertion, peripheral edema, balance deficits, hemoptysis, abdominal pain, melena, hematochezia, severe indigestion/heartburn, hematuria, incontinence, genital sores, muscle weakness, suspicious skin lesions, transient blindness, difficulty walking, depression, unusual weight change, abnormal bleeding, enlarged lymph nodes, angioedema, and breast masses.   Past Medical History  Diagnosis Date  . Gout   . Hypercholesterolemia   . Hypertension   . Thyroid disease   . Anemia   . Arthritis   . Constipation   . Chronic kidney disease   . Pulmonary embolism 1969  . Peripheral arterial occlusive disease     s/p  thrombectomy right popliteal and tibial arteries and  embolectomy right superficial femoral artery  . Gout   . Lumbar stenosis   . CAD (coronary artery disease) 1972    s/p SEMI  . Fibromyalgia   . Hypothyroidism    Past Surgical History  Procedure Laterality Date  . Vena cava umbrella placement secondary to pulmonary embolism 1969  1969  . Tubal ligation    . Appendectomy    . Tonsillectomy     Social History:  reports that she has never smoked. She does not have any smokeless tobacco history on file. She reports that she does not drink alcohol or use illicit drugs.  Allergies  Allergen Reactions  . Celebrex [Celecoxib] Diarrhea  . Demerol [Meperidine] Nausea And Vomiting  . Morphine And Related Nausea Only    CAN TAKE WITH PHENERGAN  . Codeine Nausea Only and Rash  . Dilaudid [Hydromorphone Hcl] Rash    No family history on file.   Prior to Admission medications   Medication Sig Start Date End Date Taking? Authorizing Provider  acetaminophen (TYLENOL) 500 MG tablet Take 500-1,000 mg by mouth every 6 (six) hours as needed for pain.   Yes Historical Provider, MD  allopurinol (ZYLOPRIM) 100 MG tablet Take 100 mg by mouth daily.     Yes Historical Provider, MD  amLODipine (NORVASC) 5 MG tablet Take 5 mg by mouth daily.     Yes Historical Provider, MD  bismuth subsalicylate (PEPTO BISMOL) 262 MG chewable tablet Chew 524 mg by mouth as needed for indigestion or diarrhea or loose stools.   Yes Historical Provider, MD  calcium carbonate (TUMS - DOSED IN MG ELEMENTAL CALCIUM) 500 MG chewable tablet Chew  2 tablets by mouth 2 (two) times daily as needed for indigestion or heartburn.   Yes Historical Provider, MD  cholecalciferol (VITAMIN D) 1000 UNITS tablet Take 1,000 Units by mouth daily.     Yes Historical Provider, MD  furosemide (LASIX) 20 MG tablet Take 20-40 mg by mouth daily.    Yes Historical Provider, MD  levothyroxine (SYNTHROID, LEVOTHROID) 50 MCG tablet Take 50 mcg by mouth  daily.     Yes Historical Provider, MD  losartan (COZAAR) 100 MG tablet Take 100 mg by mouth daily.     Yes Historical Provider, MD  Multiple Vitamin (MULITIVITAMIN WITH MINERALS) TABS Take 1 tablet by mouth daily.     Yes Historical Provider, MD  nitroGLYCERIN (NITROSTAT) 0.4 MG SL tablet Place 0.4 mg under the tongue every 5 (five) minutes as needed for chest pain.    Yes Historical Provider, MD  ondansetron (ZOFRAN-ODT) 8 MG disintegrating tablet Take 8 mg by mouth every 8 (eight) hours as needed for nausea. 01/16/12  Yes Renne Crigler, PA-C  ranitidine (ZANTAC) 150 MG tablet Take 150 mg by mouth daily.   Yes Historical Provider, MD  traMADol (ULTRAM) 50 MG tablet Take 100 mg by mouth 2 (two) times daily.    Yes Historical Provider, MD  warfarin (COUMADIN) 1 MG tablet Take 1 mg by mouth See admin instructions. Only take on Tuesday with a 4mg  tablet for a total of 5mg    Yes Historical Provider, MD  warfarin (COUMADIN) 4 MG tablet Take 4 mg by mouth every evening. Also take 1mg  with a 4mg  for a total of 5mg  on tuesday   Yes Historical Provider, MD   Physical Exam: Filed Vitals:   07/09/13 1616  BP: 176/106  Pulse: 105  Temp: 97.4 F (36.3 C)  Resp: 22    BP 176/106  Pulse 105  Temp(Src) 97.4 F (36.3 C) (Oral)  Resp 22  SpO2 95% Constitutional: Vital signs reviewed.  Patient is a well-developed and well-nourished in no acute distress and cooperative with exam. Alert and oriented x3.  Head: Normocephalic and atraumatic Mouth: no erythema or exudates, slightly dry mucous membranes Eyes: PERRL, EOMI, conjunctivae normal, No scleral icterus.  Neck: Supple, Trachea midline normal ROM, No JVD, mass, thyromegaly, or carotid bruit present.  Cardiovascular: Regular, mildly tachycardic, S1 normal, S2 normal, no MRG, pulses symmetric and intact bilaterally Pulmonary/Chest: normal respiratory effort, CTAB, no wheezes, rales, or rhonchi Abdominal: Soft. Mild lower abdominal tenderness, no  rebound, non-distended, bowel sounds are normal, no masses, organomegaly, or guarding present.  GU: no CVA tenderness  extremities: No cyanosis and no edema  Neurological: A&O x3, Strength is normal and symmetric bilaterally, cranial nerve II-XII are grossly intact, no focal motor deficit, sensory intact to light touch bilaterally.  Skin: Warm, dry and intact. No rash, cyanosis, or clubbing.  Psychiatric: Normal mood and affect.               Labs on Admission:  Basic Metabolic Panel:  Recent Labs Lab 07/09/13 1434  NA 140  K 3.7  CL 98  CO2 23  GLUCOSE 107*  BUN 23  CREATININE 1.18*  CALCIUM 11.1*   Liver Function Tests: No results found for this basename: AST, ALT, ALKPHOS, BILITOT, PROT, ALBUMIN,  in the last 168 hours No results found for this basename: LIPASE, AMYLASE,  in the last 168 hours No results found for this basename: AMMONIA,  in the last 168 hours CBC:  Recent Labs Lab 07/09/13 1434  WBC  9.6  NEUTROABS 7.1  HGB 13.7  HCT 40.3  MCV 88.6  PLT 363   Cardiac Enzymes: No results found for this basename: CKTOTAL, CKMB, CKMBINDEX, TROPONINI,  in the last 168 hours  BNP (last 3 results)  Recent Labs  07/09/13 1434  PROBNP 378.5   CBG: No results found for this basename: GLUCAP,  in the last 168 hours  Radiological Exams on Admission: Dg Chest 2 View  07/09/2013   CLINICAL DATA:  Chest pain and shortness of breath.  EXAM: CHEST  2 VIEW  COMPARISON:  01/16/2012.  FINDINGS: Surgical clips noted in the right axilla. Previous cholecystectomy. The heart size and mediastinal contours are within normal limits. Both lungs are clear. Scoliosis deformity involving the thoracic spine is convex towards the right. There is multi level disc space narrowing with ventral endplate spurring.  IMPRESSION: 1. No acute findings.   Electronically Signed   By: Signa Kell M.D.   On: 07/09/2013 15:05    EKG: Independently reviewed. Normal sinus rhythm at 92 with LVH,  no acute ischemic changes.  Assessment/Plan Present on Admission:  . Chest pain, atypical -As discussed above, atypical but her heart score is 4 given her age risk factors including CAD/atherosclerotic disease  -We'll cycle cardiac enzymes, admit to chest pain observation unit for cardiology to see in a.m. for further recommendations  -Aspirin, NTG when necessary, continue ARB and follow  . Chronic kidney disease -Creatinine is 1.1 which is actually improved from prior baseline of 1.5  -Follow and recheck  . HTN (hypertension) -Continue outpatient medications  . Nausea/Diarrhea-probable gastroenteritis  -Obtain C. difficile  -Supportive care  -Follow and further treat accordingly pending studies  . DOE (dyspnea on exertion) -D-dimer and chest x-ray and negative, BNP some remarkable as above -Followup on cardiac enzymes as above and further treat accordingly. Hypothyroidism -Continue Synthroid       Code Status: full  Family Communication: daughter in law at bedside Disposition Plan: Admit to telemetry  Time spent: >30  Kela Millin [facility] Pager [phone]

## 2013-07-10 ENCOUNTER — Observation Stay (HOSPITAL_COMMUNITY): Payer: Medicare Other

## 2013-07-10 ENCOUNTER — Other Ambulatory Visit: Payer: Self-pay

## 2013-07-10 ENCOUNTER — Encounter (HOSPITAL_COMMUNITY): Payer: Self-pay | Admitting: General Practice

## 2013-07-10 DIAGNOSIS — K529 Noninfective gastroenteritis and colitis, unspecified: Secondary | ICD-10-CM

## 2013-07-10 DIAGNOSIS — K5289 Other specified noninfective gastroenteritis and colitis: Secondary | ICD-10-CM

## 2013-07-10 DIAGNOSIS — R0602 Shortness of breath: Secondary | ICD-10-CM

## 2013-07-10 DIAGNOSIS — D649 Anemia, unspecified: Secondary | ICD-10-CM

## 2013-07-10 DIAGNOSIS — I517 Cardiomegaly: Secondary | ICD-10-CM

## 2013-07-10 DIAGNOSIS — R52 Pain, unspecified: Secondary | ICD-10-CM

## 2013-07-10 DIAGNOSIS — R1011 Right upper quadrant pain: Secondary | ICD-10-CM

## 2013-07-10 DIAGNOSIS — R079 Chest pain, unspecified: Secondary | ICD-10-CM

## 2013-07-10 LAB — TROPONIN I: Troponin I: <0.3 ng/mL (ref <0.30)

## 2013-07-10 LAB — PROTIME-INR
INR: 1.74 — ABNORMAL HIGH (ref 0.00–1.49)
Prothrombin Time: 19.8 seconds — ABNORMAL HIGH (ref 11.6–15.2)

## 2013-07-10 LAB — LIPID PANEL
CHOL/HDL RATIO: 3.9 ratio
CHOLESTEROL: 215 mg/dL — AB (ref 0–200)
HDL: 55 mg/dL (ref >39)
LDL Cholesterol: 130 mg/dL — ABNORMAL HIGH (ref 0–99)
Triglycerides: 149 mg/dL (ref <150)
VLDL: 30 mg/dL (ref 0–40)

## 2013-07-10 MED ORDER — WARFARIN SODIUM 5 MG PO TABS
5.0000 mg | ORAL_TABLET | Freq: Once | ORAL | Status: AC
  Administered 2013-07-10: 5 mg via ORAL
  Filled 2013-07-10: qty 1

## 2013-07-10 MED ORDER — REGADENOSON 0.4 MG/5ML IV SOLN
INTRAVENOUS | Status: AC
  Filled 2013-07-10: qty 5

## 2013-07-10 MED ORDER — TECHNETIUM TC 99M SESTAMIBI GENERIC - CARDIOLITE
30.0000 | Freq: Once | INTRAVENOUS | Status: AC | PRN
  Administered 2013-07-10: 30 via INTRAVENOUS

## 2013-07-10 MED ORDER — ONDANSETRON 8 MG PO TBDP: 8.0000 mg | ORAL_TABLET | Freq: Three times a day (TID) | ORAL | Status: AC | PRN

## 2013-07-10 MED ORDER — REGADENOSON 0.4 MG/5ML IV SOLN
0.4000 mg | Freq: Once | INTRAVENOUS | Status: AC
  Administered 2013-07-10: 0.4 mg via INTRAVENOUS
  Filled 2013-07-10: qty 5

## 2013-07-10 MED ORDER — TECHNETIUM TC 99M SESTAMIBI GENERIC - CARDIOLITE
10.0000 | Freq: Once | INTRAVENOUS | Status: AC | PRN
  Administered 2013-07-10: 10 via INTRAVENOUS

## 2013-07-10 NOTE — Progress Notes (Signed)
ANTICOAGULATION CONSULT NOTE -Follow UP  Pharmacy Consult for coumadin Indication: hx of PE  Allergies  Allergen Reactions  . Celebrex [Celecoxib] Diarrhea  . Demerol [Meperidine] Nausea And Vomiting  . Morphine And Related Nausea Only    CAN TAKE WITH PHENERGAN  . Codeine Nausea Only and Rash  . Dilaudid [Hydromorphone Hcl] Rash    Patient Measurements: Height: 5\' 4"  (162.6 cm) Weight: 139 lb 5.7 oz (63.211 kg) IBW/kg (Calculated) : 54.7 Heparin Dosing Weight:   Vital Signs: Temp: 98.3 F (36.8 C) (06/11 0736) Temp src: Oral (06/11 0736) BP: 157/57 mmHg (06/11 1019) Pulse Rate: 78 (06/11 1019)  Labs:  Recent Labs  07/09/13 1434 07/09/13 1928 07/10/13 0054 07/10/13 0456  HGB 13.7  --   --   --   HCT 40.3  --   --   --   PLT 363  --   --   --   LABPROT 18.1*  --   --  19.8*  INR 1.54*  --   --  1.74*  CREATININE 1.18*  --   --   --   TROPONINI  --  <0.30 <0.30 <0.30    Estimated Creatinine Clearance: 26.3 ml/min (by C-G formula based on Cr of 1.18).   Medical History: Past Medical History  Diagnosis Date  . Gout   . Hypercholesterolemia   . Hypertension   . Thyroid disease   . Anemia   . Arthritis   . Constipation   . Chronic kidney disease   . Pulmonary embolism 1969  . Peripheral arterial occlusive disease     s/p thrombectomy right popliteal and tibial arteries and  embolectomy right superficial femoral artery  . Gout   . Lumbar stenosis   . CAD (coronary artery disease) 1972    s/p SEMI  . Fibromyalgia   . Hypothyroidism   . Myocardial infarction   . Anginal pain   . TIA (transient ischemic attack)   . Shortness of breath     Medications:  Scheduled:  . allopurinol  100 mg Oral Daily  . amLODipine  5 mg Oral Daily  . aspirin  81 mg Oral Daily  . cholecalciferol  1,000 Units Oral Daily  . famotidine  20 mg Oral Daily  . levothyroxine  50 mcg Oral QAC breakfast  . losartan  100 mg Oral Daily  . multivitamin with minerals  1 tablet  Oral Daily  . traMADol  100 mg Oral BID  . Warfarin - Pharmacist Dosing Inpatient   Does not apply q1800   Assessment: 80 yo female admitted 07/09/2013 with diarrhea and subsequent CP with hx of PE pharmacy consulted to continue on home coumadin.  PMH:HTN, CAD, hyperlipidemia, CKD,   Coag: hx of PE,  INR remains < goal Home coumadin regimen was 4mg  daily except 5 mg on Tuesdays.  On allopurinol which she was on at home.   CV: HTN, severe PVD SBP 160-150s, HR 70s,  Amlo, asa, losartan  Endo: hypothyroid Levothyroxine, allopurinol  Goal of Therapy:  INR 2-3 Monitor platelets by anticoagulation protocol: Yes  Goal of Therapy:  INR 2-3 Monitor platelets by anticoagulation protocol: Yes   Plan:  1) Coumadin 5 mg po x1 2) Daily PT/INR  Thank you for allowing pharmacy to be a part of this patients care team.  Lovenia Kim Pharm.D., BCPS, AQ-Cardiology Clinical Pharmacist 07/10/2013 10:30 AM Pager: [phone] Phone: [phone]

## 2013-07-10 NOTE — Progress Notes (Signed)
  Echocardiogram 2D Echocardiogram has been performed.  Georgian Co 07/10/2013, 4:31 PM

## 2013-07-10 NOTE — Consult Note (Signed)
Admit date: 07/09/2013 Referring Physician  Dr. Suanne MarkerViyuoh Primary Physician  Dr. Duane LopeAlan Ross Primary Cardiologist  Dr. [place] Reason for Consultation  Chest pain and SOB  HPI: Katherine Cuevas is a 80 y.o. female with history of hypertension,  peripheral arterial occlusive disease, hypercholesterolemia, CKD who presents with above complaints. She relates that she began having diarrhea last PM -multiple nonbloody stools throughout this a.m, with associated nausea but no vomiting. She states she has not had any further diarrhea since coming to the ED, and no sick contacts. This morning she states that about 11:30 she began having chest tightness-described as severe, associated with dyspnea and nonradiating. Her daughter in law states that the chest tightness seemed to have started after she drank some Pepsi. She was seen in the ED and chest x-ray showed no acute findings, EKG showed normal sinus rhythm at 92, LVH with no acute ischemic changes, troponin negative, UA within normal limits, d-dimer normal at 0.36, and BNP unremarkable at 378.5. She is admitted for further evaluation and management.  Cardiac enzymes have been normal x 3.  Of note, I cannot find any documentation of prior MI and CAD even though this is stated in the chart as having had an MI in 1972.  Per Dr. Elvis CoilJordan's consult note in 2010, she has an extensive history of PVD and has had 2 normal caths in the past both in 1996 and 1999 and a normal adenosine CL in 10/08 with normal LVF.  She also has a history of pulmonary Emboli and fibromyalgia.  She has a family history of CAD in her mom in her 7570's.  She is a nonsmoker. Cardiology is now asked to consult.       PMH:   Past Medical History  Diagnosis Date  . Gout   . Hypercholesterolemia   . Hypertension   . Thyroid disease   . Anemia   . Arthritis   . Constipation   . Chronic kidney disease   . Pulmonary embolism 1969  . Peripheral arterial occlusive disease     s/p thrombectomy right  popliteal and tibial arteries and  embolectomy right superficial femoral artery  . Gout   . Lumbar stenosis   . CAD (coronary artery disease) 1972    s/p SEMI  . Fibromyalgia   . Hypothyroidism   . Myocardial infarction   . Anginal pain   . TIA (transient ischemic attack)   . Shortness of breath      PSH:   Past Surgical History  Procedure Laterality Date  . Vena cava umbrella placement secondary to pulmonary embolism 1969  1969  . Tubal ligation    . Appendectomy    . Tonsillectomy    . Abdominal hysterectomy      Allergies:  Celebrex; Demerol; Morphine and related; Codeine; and Dilaudid Prior to Admit Meds:   Prescriptions prior to admission  Medication Sig Dispense Refill  . acetaminophen (TYLENOL) 500 MG tablet Take 500-1,000 mg by mouth every 6 (six) hours as needed for pain.      Marland Kitchen. allopurinol (ZYLOPRIM) 100 MG tablet Take 100 mg by mouth daily.        Marland Kitchen. amLODipine (NORVASC) 5 MG tablet Take 5 mg by mouth daily.        Marland Kitchen. bismuth subsalicylate (PEPTO BISMOL) 262 MG chewable tablet Chew 524 mg by mouth as needed for indigestion or diarrhea or loose stools.      . calcium carbonate (TUMS - DOSED IN MG ELEMENTAL CALCIUM) 500  MG chewable tablet Chew 2 tablets by mouth 2 (two) times daily as needed for indigestion or heartburn.      . cholecalciferol (VITAMIN D) 1000 UNITS tablet Take 1,000 Units by mouth daily.        . furosemide (LASIX) 20 MG tablet Take 20-40 mg by mouth daily.       Marland Kitchen levothyroxine (SYNTHROID, LEVOTHROID) 50 MCG tablet Take 50 mcg by mouth daily.        Marland Kitchen losartan (COZAAR) 100 MG tablet Take 100 mg by mouth daily.        . Multiple Vitamin (MULITIVITAMIN WITH MINERALS) TABS Take 1 tablet by mouth daily.        . nitroGLYCERIN (NITROSTAT) 0.4 MG SL tablet Place 0.4 mg under the tongue every 5 (five) minutes as needed for chest pain.       Marland Kitchen ondansetron (ZOFRAN-ODT) 8 MG disintegrating tablet Take 8 mg by mouth every 8 (eight) hours as needed for nausea.        . ranitidine (ZANTAC) 150 MG tablet Take 150 mg by mouth daily.      . traMADol (ULTRAM) 50 MG tablet Take 100 mg by mouth 2 (two) times daily.       Marland Kitchen warfarin (COUMADIN) 1 MG tablet Take 1 mg by mouth See admin instructions. Only take on Tuesday with a 4mg  tablet for a total of 5mg       . warfarin (COUMADIN) 4 MG tablet Take 4 mg by mouth every evening. Also take 1mg  with a 4mg  for a total of 5mg  on tuesday       Fam HX:   History reviewed. No pertinent family history. Social HX:    History   Social History  . Marital Status: Widowed    Spouse Name: N/A    Number of Children: N/A  . Years of Education: N/A   Occupational History  . Not on file.   Social History Main Topics  . Smoking status: Never Smoker   . Smokeless tobacco: Never Used  . Alcohol Use: No  . Drug Use: No  . Sexual Activity: Not on file   Other Topics Concern  . Not on file   Social History Narrative  . No narrative on file     ROS:  All 11 ROS were addressed and are negative except what is stated in the HPI  Physical Exam: Blood pressure 158/61, pulse 74, temperature 98.3 F (36.8 C), temperature source Oral, resp. rate 21, height 5\' 4"  (1.626 m), weight 139 lb 5.7 oz (63.211 kg), SpO2 92.00%.    General: Well developed, well nourished, in no acute distress Head: Eyes PERRLA, No xanthomas.   Normal cephalic and atramatic  Lungs:   Clear bilaterally to auscultation and percussion. Heart:   HRRR S1 S2 Pulses are 2+ & equal.            No carotid bruit. No JVD.  No abdominal bruits. No femoral bruits. Abdomen: Bowel sounds are positive, abdomen soft and non-tender without masses Extremities:   No clubbing, cyanosis or edema.  DP +1 Neuro: Alert and oriented X 3. Psych:  Good affect, responds appropriately    Labs:   Lab Results  Component Value Date   WBC 9.6 07/09/2013   HGB 13.7 07/09/2013   HCT 40.3 07/09/2013   MCV 88.6 07/09/2013   PLT 363 07/09/2013    Recent Labs Lab 07/09/13 1434   NA 140  K 3.7  CL 98  CO2 23  BUN 23  CREATININE 1.18*  CALCIUM 11.1*  GLUCOSE 107*   No results found for this basename: PTT   Lab Results  Component Value Date   INR 1.74* 07/10/2013   INR 1.54* 07/09/2013   INR 2.05* 01/16/2012   Lab Results  Component Value Date   TROPONINI <0.30 07/10/2013     Lab Results  Component Value Date   CHOL 215* 07/10/2013   Lab Results  Component Value Date   HDL 55 07/10/2013   Lab Results  Component Value Date   LDLCALC 130* 07/10/2013   Lab Results  Component Value Date   TRIG 149 07/10/2013   Lab Results  Component Value Date   CHOLHDL 3.9 07/10/2013   No results found for this basename: LDLDIRECT      Radiology:  Dg Chest 2 View  07/09/2013   CLINICAL DATA:  Chest pain and shortness of breath.  EXAM: CHEST  2 VIEW  COMPARISON:  01/16/2012.  FINDINGS: Surgical clips noted in the right axilla. Previous cholecystectomy. The heart size and mediastinal contours are within normal limits. Both lungs are clear. Scoliosis deformity involving the thoracic spine is convex towards the right. There is multi level disc space narrowing with ventral endplate spurring.  IMPRESSION: 1. No acute findings.   Electronically Signed   By: Signa Kell M.D.   On: 07/09/2013 15:05    EKG:  NSR with LAFB and IRBBB  ASSESSMENT:  1.  Chest pain with typical and atypical components.  Her pain was stated by the Hospitalist to come on after drinking pepsi.  She says she had drank a pepsi earlier but pain was not in association with drinking.  She broke out in a sweat with the pain that was like "an elephant sitting on my chest".  She took SL NTG that helped.  She has not had any further episodes.  This pain, though, was in the setting of what sounds like acute gastroenteritis with diarrhea and nausea x 24 hours.  Her EKG is nonischemic and she has rule out for MI by enzymes.  She is advanced in age, but she gets around at home without difficulty and takes full  care of herself. Given her history of PVD and HTN she does have CRF.   2.  Severe PVD 3.  HTN 4.  SOB with negative d-dimer.  BNP mildly elevated but she does not appear to be volume overloaded on exam   PLAN:   1.  Lexiscan myoview today to rule out ischemia 2.  2D echo to reassess LVF, diastolic dysfunction  Quintella Reichert, MD  07/10/2013  9:55 AM

## 2013-07-10 NOTE — Discharge Summary (Signed)
Physician Discharge Summary  Katherine SavageJetty W Anello ZOX:[id number] DOB: 23-Dec-1919 DOA: 07/09/2013  PCP:  Duane Lopeoss, Alan, MD  Admit date: 07/09/2013 Discharge date: 07/10/2013  Time spent: 35 minutes  Recommendations for Outpatient Follow-up:  1. Follow up with PCP in 1-2 weeks  Discharge Diagnoses:  Principal Problem:   Gastroenteritis Active Problems:   Chronic kidney disease   Nausea   HTN (hypertension)   Chest pain   Diarrhea   DOE (dyspnea on exertion)   Discharge Condition: Improved  Diet recommendation: Regular  Filed Weights   07/09/13 1736 07/10/13 0500  Weight: 142 lb (64.411 kg) 139 lb 5.7 oz (63.211 kg)    History of present illness:  Katherine Cuevas is a 80 y.o. female with history of hypertension, CAD status post MI in the past, peripheral arterial occlusive disease, hypercholesterolemia, CKD who presents with above complaints. She relates that she began having diarrhea last PM -multiple nonbloody stools throughout this a.m, with associated nausea but no vomiting. She states she has not had any further diarrhea since coming to the ED, and no sick contacts. This morning she states that about 11:30 she began having chest tightness-described as severe, associated with dyspnea and nonradiating. Her daughter in law states that the chest tightness seemed to have started after she drank some Pepsi. She was seen in the ED and chest x-ray showed no acute findings, EKG showed normal sinus rhythm at 92, LVH with no acute ischemic changes, troponin negative, UA within normal limits, d-dimer normal at 0.36, and BNP unremarkable at 378.5. She is admitted for further evaluation and management.  Hospital Course:  . Chest pain, atypical  -As discussed above, atypical but her heart score is 4 given her age risk factors including CAD/atherosclerotic disease  -Serial cardiac enzymes neg  -For stress test 6/11 which was unremarkable - 2D echo was unremarkable -Aspirin, NTG when necessary, continue  ARB and follow   . Chronic kidney disease  -Creatinine is 1.1 which is actually improved from prior baseline of 1.5   . HTN (hypertension)  -Continue outpatient medications   . Nausea/Diarrhea-probable gastroenteritis  -Supportive care  -Much improved by the day of discharge -On further questioning, pt admits to marked n/v/d shortly after eating scallops from restaurant just prior to admit  . DOE (dyspnea on exertion)  -D-dimer and chest x-ray and negative, BNP some remarkable as above  Hypothyroidism  -Continue Synthroid   Procedures:  Stress test neg 6/11  Consultations:  Cardiology  Discharge Exam: Filed Vitals:   07/10/13 1340 07/10/13 1342 07/10/13 1344 07/10/13 1346  BP: 145/47 137/51 133/57 133/62  Pulse: 83 88 81 81  Temp:      TempSrc:      Resp:      Height:      Weight:      SpO2:        General: Awake, in nad Cardiovascular: regular, s1, s2 Respiratory: normal resp effort, no wheezing  Discharge Instructions     Medication List         acetaminophen 500 MG tablet  Commonly known as:  TYLENOL  Take 500-1,000 mg by mouth every 6 (six) hours as needed for pain.     allopurinol 100 MG tablet  Commonly known as:  ZYLOPRIM  Take 100 mg by mouth daily.     amLODipine 5 MG tablet  Commonly known as:  NORVASC  Take 5 mg by mouth daily.     bismuth subsalicylate 262 MG chewable tablet  Commonly known  as:  PEPTO BISMOL  Chew 524 mg by mouth as needed for indigestion or diarrhea or loose stools.     calcium carbonate 500 MG chewable tablet  Commonly known as:  TUMS - dosed in mg elemental calcium  Chew 2 tablets by mouth 2 (two) times daily as needed for indigestion or heartburn.     cholecalciferol 1000 UNITS tablet  Commonly known as:  VITAMIN D  Take 1,000 Units by mouth daily.     furosemide 20 MG tablet  Commonly known as:  LASIX  Take 20-40 mg by mouth daily.     levothyroxine 50 MCG tablet  Commonly known as:  SYNTHROID,  LEVOTHROID  Take 50 mcg by mouth daily.     losartan 100 MG tablet  Commonly known as:  COZAAR  Take 100 mg by mouth daily.     multivitamin with minerals Tabs tablet  Take 1 tablet by mouth daily.     nitroGLYCERIN 0.4 MG SL tablet  Commonly known as:  NITROSTAT  Place 0.4 mg under the tongue every 5 (five) minutes as needed for chest pain.     ondansetron 8 MG disintegrating tablet  Commonly known as:  ZOFRAN-ODT  Take 1 tablet (8 mg total) by mouth every 8 (eight) hours as needed for nausea.     ranitidine 150 MG tablet  Commonly known as:  ZANTAC  Take 150 mg by mouth daily.     traMADol 50 MG tablet  Commonly known as:  ULTRAM  Take 100 mg by mouth 2 (two) times daily.     warfarin 4 MG tablet  Commonly known as:  COUMADIN  Take 4 mg by mouth every evening. Also take 1mg  with a 4mg  for a total of 5mg  on tuesday     warfarin 1 MG tablet  Commonly known as:  COUMADIN  Take 1 mg by mouth See admin instructions. Only take on Tuesday with a 4mg  tablet for a total of 5mg        Allergies  Allergen Reactions  . Celebrex [Celecoxib] Diarrhea  . Demerol [Meperidine] Nausea And Vomiting  . Morphine And Related Nausea Only    CAN TAKE WITH PHENERGAN  . Codeine Nausea Only and Rash  . Dilaudid [Hydromorphone Hcl] Rash   Follow-up Information   Follow up with  Duane Lope, MD. Schedule an appointment as soon as possible for a visit in 1 week.   Specialty:  Family Medicine   Contact information:   [address] [place] [address] [phone]        The results of significant diagnostics from this hospitalization (including imaging, microbiology, ancillary and laboratory) are listed below for reference.    Significant Diagnostic Studies: Dg Chest 2 View  07/09/2013   CLINICAL DATA:  Chest pain and shortness of breath.  EXAM: CHEST  2 VIEW  COMPARISON:  01/16/2012.  FINDINGS: Surgical clips noted in the right axilla. Previous cholecystectomy. The heart size and  mediastinal contours are within normal limits. Both lungs are clear. Scoliosis deformity involving the thoracic spine is convex towards the right. There is multi level disc space narrowing with ventral endplate spurring.  IMPRESSION: 1. No acute findings.   Electronically Signed   By: Signa Kell M.D.   On: 07/09/2013 15:05   Nm Myocar Single W/spect W/wall Motion And Ef  07/10/2013   CLINICAL DATA:  Patient is a 80 yo with history of CP Test to evaluate, rule out ischemia.  EXAM: MYOCARDIAL IMAGING WITH  SPECT (REST AND PHARMACOLOGIC-STRESS)  GATED LEFT VENTRICULAR WALL MOTION STUDY  LEFT VENTRICULAR EJECTION FRACTION  TECHNIQUE: Standard myocardial SPECT imaging was performed after resting intravenous injection of 10 mCi Tc-83m sestamibi. Subsequently, intravenous infusion of Lexiscan was performed under the supervision of the Cardiology staff. At peak effect of the drug, 30 mCi Tc-40m sestamibi was injected intravenously and standard myocardial SPECT imaging was performed. Quantitative gated imaging was also performed to evaluate left ventricular wall motion, and estimate left ventricular ejection fraction.  COMPARISON:  None.  FINDINGS: Stress data: Baseline EKG: SR 75 bpm Poor R wave progression With infusion of Lexiscan there were no EKG changes to suggest ischemia.  Nuclear data: IN the initial stress images There was mild thinning in the inferior(base) and inferolateral wall (base, mid). Otherwise normal perfusion.  IN the recovery images there was minimal changes Overall did not appear to be significant for ischemia.  IMPRESSION: Lexiscan myoview: Electrically negative for ischemia. Myoview scan with mild inferolateral thinning consistent with probable soft tissue attenuation (diaphragm). NO evidence for significant ischemia or scar LVEF calculated at 78% with normal wall motion.   Electronically Signed   By: Dietrich Pates M.D.   On: 07/10/2013 16:31    Microbiology: No results found for this or any  previous visit (from the past 240 hour(s)).   Labs: Basic Metabolic Panel:  Recent Labs Lab 07/09/13 1434  NA 140  K 3.7  CL 98  CO2 23  GLUCOSE 107*  BUN 23  CREATININE 1.18*  CALCIUM 11.1*   Liver Function Tests: No results found for this basename: AST, ALT, ALKPHOS, BILITOT, PROT, ALBUMIN,  in the last 168 hours No results found for this basename: LIPASE, AMYLASE,  in the last 168 hours No results found for this basename: AMMONIA,  in the last 168 hours CBC:  Recent Labs Lab 07/09/13 1434  WBC 9.6  NEUTROABS 7.1  HGB 13.7  HCT 40.3  MCV 88.6  PLT 363   Cardiac Enzymes:  Recent Labs Lab 07/09/13 1928 07/10/13 0054 07/10/13 0456  TROPONINI <0.30 <0.30 <0.30   BNP: BNP (last 3 results)  Recent Labs  07/09/13 1434  PROBNP 378.5   CBG: No results found for this basename: GLUCAP,  in the last 168 hours  Signed:  Ryliegh Mcduffey K  [facility] 07/10/2013, 6:25 PM

## 2013-07-10 NOTE — Progress Notes (Signed)
UR completed 

## 2013-07-10 NOTE — Progress Notes (Addendum)
Nuc results showed no evidence of ischemia or scar, EF 78%. Pt made aware of result. We are still waiting on echo results at this time. Dayna Dunn PA-C  Echo results per Dr. Mayford Knife: normal LV function, grade 1 diastolic dysfunction, trivial AI, MR, mildly dilated LA. Full report in Epic to follow but OK to be discharged from cardiac standpoint. Let patient know and sent text page to IM. Dayna Dunn PA-C

## 2013-07-10 NOTE — Progress Notes (Signed)
The patient tolerated the Lexiscan well.  She did have some mild CP.  Interpretation to follow.  Wilburt Finlay PAC

## 2014-01-02 ENCOUNTER — Other Ambulatory Visit: Payer: Self-pay

## 2014-01-02 ENCOUNTER — Encounter (HOSPITAL_COMMUNITY): Payer: Self-pay | Admitting: Family Medicine

## 2014-01-02 ENCOUNTER — Emergency Department (HOSPITAL_COMMUNITY): Payer: Medicare Other

## 2014-01-02 ENCOUNTER — Emergency Department (HOSPITAL_COMMUNITY)
Admission: EM | Admit: 2014-01-02 | Discharge: 2014-01-02 | Disposition: A | Discharge status: Home / Self Care | Payer: Medicare Other | Attending: Emergency Medicine | Admitting: Emergency Medicine

## 2014-01-02 DIAGNOSIS — Z8673 Personal history of transient ischemic attack (TIA), and cerebral infarction without residual deficits: Secondary | ICD-10-CM | POA: Insufficient documentation

## 2014-01-02 DIAGNOSIS — Z79899 Other long term (current) drug therapy: Secondary | ICD-10-CM | POA: Insufficient documentation

## 2014-01-02 DIAGNOSIS — R079 Chest pain, unspecified: Secondary | ICD-10-CM | POA: Diagnosis not present

## 2014-01-02 DIAGNOSIS — Z7901 Long term (current) use of anticoagulants: Secondary | ICD-10-CM | POA: Diagnosis not present

## 2014-01-02 DIAGNOSIS — M109 Gout, unspecified: Secondary | ICD-10-CM | POA: Diagnosis not present

## 2014-01-02 DIAGNOSIS — M797 Fibromyalgia: Secondary | ICD-10-CM | POA: Diagnosis not present

## 2014-01-02 DIAGNOSIS — Z86711 Personal history of pulmonary embolism: Secondary | ICD-10-CM | POA: Insufficient documentation

## 2014-01-02 DIAGNOSIS — M25512 Pain in left shoulder: Secondary | ICD-10-CM | POA: Diagnosis not present

## 2014-01-02 DIAGNOSIS — Z862 Personal history of diseases of the blood and blood-forming organs and certain disorders involving the immune mechanism: Secondary | ICD-10-CM | POA: Diagnosis not present

## 2014-01-02 DIAGNOSIS — M79602 Pain in left arm: Secondary | ICD-10-CM | POA: Insufficient documentation

## 2014-01-02 DIAGNOSIS — I252 Old myocardial infarction: Secondary | ICD-10-CM | POA: Diagnosis not present

## 2014-01-02 DIAGNOSIS — I2510 Atherosclerotic heart disease of native coronary artery without angina pectoris: Secondary | ICD-10-CM | POA: Diagnosis not present

## 2014-01-02 DIAGNOSIS — N189 Chronic kidney disease, unspecified: Secondary | ICD-10-CM | POA: Diagnosis not present

## 2014-01-02 DIAGNOSIS — E039 Hypothyroidism, unspecified: Secondary | ICD-10-CM | POA: Diagnosis not present

## 2014-01-02 DIAGNOSIS — E780 Pure hypercholesterolemia: Secondary | ICD-10-CM | POA: Diagnosis not present

## 2014-01-02 DIAGNOSIS — I129 Hypertensive chronic kidney disease with stage 1 through stage 4 chronic kidney disease, or unspecified chronic kidney disease: Secondary | ICD-10-CM | POA: Insufficient documentation

## 2014-01-02 LAB — CBC WITH DIFFERENTIAL/PLATELET
BASOS PCT: 0 % (ref 0–1)
Basophils Absolute: 0 10*3/uL (ref 0.0–0.1)
EOS ABS: 0.3 10*3/uL (ref 0.0–0.7)
Eosinophils Relative: 5 % (ref 0–5)
HEMATOCRIT: 39.2 % (ref 36.0–46.0)
Hemoglobin: 13.2 g/dL (ref 12.0–15.0)
Lymphocytes Relative: 34 % (ref 12–46)
Lymphs Abs: 2.2 10*3/uL (ref 0.7–4.0)
MCH: 29.7 pg (ref 26.0–34.0)
MCHC: 33.7 g/dL (ref 30.0–36.0)
MCV: 88.1 fL (ref 78.0–100.0)
MONO ABS: 0.5 10*3/uL (ref 0.1–1.0)
MONOS PCT: 7 % (ref 3–12)
Neutro Abs: 3.5 10*3/uL (ref 1.7–7.7)
Neutrophils Relative %: 54 % (ref 43–77)
Platelets: 295 10*3/uL (ref 150–400)
RBC: 4.45 MIL/uL (ref 3.87–5.11)
RDW: 14.5 % (ref 11.5–15.5)
WBC: 6.6 10*3/uL (ref 4.0–10.5)

## 2014-01-02 LAB — COMPREHENSIVE METABOLIC PANEL
ALK PHOS: 59 U/L (ref 39–117)
ALT: 14 U/L (ref 0–35)
ANION GAP: 13 (ref 5–15)
AST: 37 U/L (ref 0–37)
Albumin: 4 g/dL (ref 3.5–5.2)
BUN: 28 mg/dL — AB (ref 6–23)
CHLORIDE: 104 meq/L (ref 96–112)
CO2: 25 mEq/L (ref 19–32)
Calcium: 9.6 mg/dL (ref 8.4–10.5)
Creatinine, Ser: 1.25 mg/dL — ABNORMAL HIGH (ref 0.50–1.10)
GFR calc non Af Amer: 36 mL/min — ABNORMAL LOW (ref >90)
GFR, EST AFRICAN AMERICAN: 42 mL/min — AB (ref >90)
GLUCOSE: 103 mg/dL — AB (ref 70–99)
POTASSIUM: 5.2 meq/L (ref 3.7–5.3)
Sodium: 142 mEq/L (ref 137–147)
Total Bilirubin: 0.3 mg/dL (ref 0.3–1.2)
Total Protein: 7.3 g/dL (ref 6.0–8.3)

## 2014-01-02 LAB — PROTIME-INR
INR: 3.02 — ABNORMAL HIGH (ref 0.00–1.49)
Prothrombin Time: 31.5 seconds — ABNORMAL HIGH (ref 11.6–15.2)

## 2014-01-02 MED ORDER — ONDANSETRON HCL 4 MG/2ML IJ SOLN
4.0000 mg | Freq: Once | INTRAMUSCULAR | Status: AC
  Administered 2014-01-02: 4 mg via INTRAVENOUS

## 2014-01-02 MED ORDER — MORPHINE SULFATE 4 MG/ML IJ SOLN
4.0000 mg | Freq: Once | INTRAMUSCULAR | Status: AC
  Administered 2014-01-02: 4 mg via INTRAVENOUS

## 2014-01-02 MED ORDER — HYDROCODONE-ACETAMINOPHEN 5-325 MG PO TABS: 1.0000 | ORAL_TABLET | ORAL | Status: AC | PRN

## 2014-01-02 NOTE — ED Provider Notes (Signed)
CSN: [id number]     Arrival date & time 01/02/14  4098 History   First MD Initiated Contact with Patient 01/02/14 [phone]     Chief Complaint  Patient presents with  . Arm Pain  . Chest Pain     (Consider location/radiation/quality/duration/timing/severity/associated sxs/prior Treatment) Patient is a 80 y.o. female presenting with arm pain and chest pain. The history is provided by the patient and medical records.  Arm Pain Associated symptoms include arthralgias and chest pain.  Chest Pain   This is a 80 year old female with past medical history significant for hypertension, hypercholesterolemia, anemia, chronic kidney disease, pulmonary embolus on Coumadin, gout, presenting to the ED for left shoulder and arm pain. No recent injury, trauma, or falls.  Patient states pain began yesterday and has been progressively worsening. States it begins in the shoulder and radiates down her entire arm.  Pain worse with movement, better with lying still.  She has tried taking her home tramadol and applying ice to the area with some relief yesterday, no meds taken today. She is beginning to experience some radiation of pain into her chest. She denies any shortness of breath, diaphoresis, nausea, vomiting, or diarrhea. No lightheadedness or dizziness. No recent cough, fever, sweats, or chills.  Patient given 100 fentanyl and zofran en route with some improvement of her pain.  Patient has had cortisone injections in her left shoulder in the past by her PCP, Dr. Tenny Craw.  Past Medical History  Diagnosis Date  . Gout   . Hypercholesterolemia   . Hypertension   . Thyroid disease   . Anemia   . Arthritis   . Constipation   . Chronic kidney disease   . Pulmonary embolism 1969  . Peripheral arterial occlusive disease     s/p thrombectomy right popliteal and tibial arteries and  embolectomy right superficial femoral artery  . Gout   . Lumbar stenosis   . CAD (coronary artery disease) 1972    s/p SEMI  .  Fibromyalgia   . Hypothyroidism   . Myocardial infarction   . Anginal pain   . TIA (transient ischemic attack)   . Shortness of breath    Past Surgical History  Procedure Laterality Date  . Vena cava umbrella placement secondary to pulmonary embolism 1969  1969  . Tubal ligation    . Appendectomy    . Tonsillectomy    . Abdominal hysterectomy     No family history on file. History  Substance Use Topics  . Smoking status: Never Smoker   . Smokeless tobacco: Never Used  . Alcohol Use: No   OB History    No data available     Review of Systems  Cardiovascular: Positive for chest pain.  Musculoskeletal: Positive for arthralgias.  All other systems reviewed and are negative.     Allergies  Celebrex; Demerol; Morphine and related; Codeine; and Dilaudid  Home Medications   Prior to Admission medications   Medication Sig Start Date End Date Taking? Authorizing Provider  acetaminophen (TYLENOL) 500 MG tablet Take 500-1,000 mg by mouth every 6 (six) hours as needed for pain.    Historical Provider, MD  allopurinol (ZYLOPRIM) 100 MG tablet Take 100 mg by mouth daily.      Historical Provider, MD  amLODipine (NORVASC) 5 MG tablet Take 5 mg by mouth daily.      Historical Provider, MD  bismuth subsalicylate (PEPTO BISMOL) 262 MG chewable tablet Chew 524 mg by mouth as needed for indigestion or diarrhea  or loose stools.    Historical Provider, MD  calcium carbonate (TUMS - DOSED IN MG ELEMENTAL CALCIUM) 500 MG chewable tablet Chew 2 tablets by mouth 2 (two) times daily as needed for indigestion or heartburn.    Historical Provider, MD  cholecalciferol (VITAMIN D) 1000 UNITS tablet Take 1,000 Units by mouth daily.      Historical Provider, MD  furosemide (LASIX) 20 MG tablet Take 20-40 mg by mouth daily.     Historical Provider, MD  levothyroxine (SYNTHROID, LEVOTHROID) 50 MCG tablet Take 50 mcg by mouth daily.      Historical Provider, MD  losartan (COZAAR) 100 MG tablet Take 100  mg by mouth daily.      Historical Provider, MD  Multiple Vitamin (MULITIVITAMIN WITH MINERALS) TABS Take 1 tablet by mouth daily.      Historical Provider, MD  nitroGLYCERIN (NITROSTAT) 0.4 MG SL tablet Place 0.4 mg under the tongue every 5 (five) minutes as needed for chest pain.     Historical Provider, MD  ondansetron (ZOFRAN-ODT) 8 MG disintegrating tablet Take 1 tablet (8 mg total) by mouth every 8 (eight) hours as needed for nausea. 07/10/13   Jerald KiefStephen K Chiu, MD  ranitidine (ZANTAC) 150 MG tablet Take 150 mg by mouth daily.    Historical Provider, MD  traMADol (ULTRAM) 50 MG tablet Take 100 mg by mouth 2 (two) times daily.     Historical Provider, MD  warfarin (COUMADIN) 1 MG tablet Take 1 mg by mouth See admin instructions. Only take on Tuesday with a 4mg  tablet for a total of 5mg     Historical Provider, MD  warfarin (COUMADIN) 4 MG tablet Take 4 mg by mouth every evening. Also take 1mg  with a 4mg  for a total of 5mg  on tuesday    Historical Provider, MD   BP 184/75 mmHg  Pulse 85  Temp(Src) 97.6 F (36.4 C) (Oral)  Resp 14  SpO2 94%   Physical Exam  Constitutional: She is oriented to person, place, and time. She appears well-developed and well-nourished.  HENT:  Head: Normocephalic and atraumatic.  Mouth/Throat: Oropharynx is clear and moist.  Eyes: Conjunctivae and EOM are normal. Pupils are equal, round, and reactive to light.  Neck: Normal range of motion.  Cardiovascular: Normal rate, regular rhythm and normal heart sounds.   Pulmonary/Chest: Effort normal and breath sounds normal. No respiratory distress.  Abdominal: Soft. Bowel sounds are normal.  Musculoskeletal: Normal range of motion.       Left shoulder: She exhibits tenderness, bony tenderness and pain. She exhibits no swelling and no deformity.  Left shoulder exquisitely tender to palpation, limited range of motion secondary to pain, no visible deformities or signs of trauma, no swelling or erythema, no varicosities,  arm remained neurovascularly intact, normal grip strength  Neurological: She is alert and oriented to person, place, and time.  Skin: Skin is warm and dry.  Psychiatric: She has a normal mood and affect.  Nursing note and vitals reviewed.   ED Course  Procedures (including critical care time) Labs Review Labs Reviewed  COMPREHENSIVE METABOLIC PANEL - Abnormal; Notable for the following:    Glucose, Bld 103 (*)    BUN 28 (*)    Creatinine, Ser 1.25 (*)    GFR calc non Af Amer 36 (*)    GFR calc Af Amer 42 (*)    All other components within normal limits  PROTIME-INR - Abnormal; Notable for the following:    Prothrombin Time 31.5 (*)  INR 3.02 (*)    All other components within normal limits  CBC WITH DIFFERENTIAL  I-STAT TROPOININ, ED   poc troponin not crossing over, resulted at 0.01  Imaging Review Dg Chest 1 View  01/02/2014   CLINICAL DATA:  Chest pain.  EXAM: CHEST - 1 VIEW  COMPARISON:  July 09, 2013.  FINDINGS: The heart size and mediastinal contours are within normal limits. Both lungs are clear. No pneumothorax or pleural effusion is noted. Surgical clips are noted in right axillary region. The visualized skeletal structures are unremarkable.  IMPRESSION: No acute cardiopulmonary abnormality seen.   Electronically Signed   By: Roque LiasJames  Green M.D.   On: 01/02/2014 09:23     EKG Interpretation None      MUSE NOT WORKING  Date: 01/02/2014  Rate: 82  Rhythm: normal sinus rhythm  QRS Axis: normal  Intervals: normal  ST/T Wave abnormalities: normal  Conduction Disutrbances:right bundle branch block and left anterior fascicular block  Narrative Interpretation:   Old EKG Reviewed: unchanged    MDM   Final diagnoses:  Chest pain  Left shoulder pain   80 year old female with complaint of left shoulder pain, ongoing since yesterday. No reported injuries, trauma, or falls. Pain worsened upon waking this morning, some radiation into chest. No shortness of breath,  diaphoresis, dizziness, nausea, or vomiting. Patient is on Coumadin for prior PE.  EKG sinus rhythm with right bundle branch block and left anterior fascicular block, unchanged from previous. Will obtain basic labs, troponin, chest x-ray. Patient given morphine for pain.  Troponin negative. Lab work overall reassuring.  SrCr 1.25 which appears baseline for patient when compared with previous. INR is therapeutic at 3.02. Chest x-ray is clear. Improvement of pain after morphine.  Low suspicion for ACS, PE, dissection, or other acute cardiac event. Feel strongly that her symptoms are muscular skeletal in nature. Family states they feel she needs something stronger than her home tramadol-- short supply vicodin given.  Encouraged close FU with PCP.  Discussed plan with patient, he/she acknowledged understanding and agreed with plan of care.  Return precautions given for new or worsening symptoms.  Case discussed with attending physician, Dr. Ethelda ChickJacubowitz, who evaluated patient and agrees with assessment and plan of care.  Garlon HatchetLisa M Burdett Pinzon, PA-C 01/02/14 1411  Doug SouSam Jacubowitz, MD 01/02/14 [phone]

## 2014-01-02 NOTE — ED Notes (Signed)
Patient transported to X-ray 

## 2014-01-02 NOTE — ED Notes (Signed)
Pt having left shoulder pain radiating down her left arm and into left chest since yesterday. Pt used Toradol and ice pack yesterday with some relief. Today worse. No swelling or deformity noted. Hx of clot. Hx of orthopedic problems in that arm. IV RAC.

## 2014-01-02 NOTE — ED Provider Notes (Signed)
His of left shoulder pain onset 24 hours ago worse with moving her left shoulder. Also has left chest pain immediately medial to left before meals joint also worse when she moves her shoulder. She treated herself with tramadol, and ice pack, and Tylenol yesterday with relief. She's taken nothing today. She denies shortness of breath. She's had similar episodes multiple times in the past. Last received a cortisone injection into her shoulder by Dr. Tenny Crawoss 2 years ago with relief. On exam no distress lungs clear auscultation heart regular rhythm left upper extremity without redness or swelling. Limited range of motion of shoulder secondary to pain. Radial pulse 2+ Pain felt to be musculoskeletal in etiology  Doug SouSam Sylvain Hasten, MD 01/02/14 [phone]

## 2014-01-02 NOTE — ED Notes (Signed)
Pt undressed, in gown, on monitor, continuous pulse oximetry and blood pressure cuff 

## 2014-01-02 NOTE — ED Notes (Signed)
Pt placed on a bedpan to use

## 2014-01-02 NOTE — Discharge Instructions (Signed)
Continue your home pain medications. May continue to ice shoulder if this is helping your pain. Follow up with Dr. Tenny Crawoss. Return to the ED for new concerns.

## 2014-01-05 LAB — I-STAT TROPONIN, ED: Troponin i, poc: 0.01 ng/mL (ref 0.00–0.08)

## 2016-01-16 ENCOUNTER — Encounter (HOSPITAL_COMMUNITY): Payer: Self-pay

## 2016-01-16 ENCOUNTER — Emergency Department (HOSPITAL_COMMUNITY)
Admission: EM | Admit: 2016-01-16 | Discharge: 2016-01-17 | Disposition: A | Discharge status: Home / Self Care | Payer: Medicare Other | Attending: Emergency Medicine | Admitting: Emergency Medicine

## 2016-01-16 ENCOUNTER — Emergency Department (HOSPITAL_COMMUNITY): Payer: Medicare Other

## 2016-01-16 DIAGNOSIS — S41111A Laceration without foreign body of right upper arm, initial encounter: Secondary | ICD-10-CM | POA: Diagnosis not present

## 2016-01-16 DIAGNOSIS — N189 Chronic kidney disease, unspecified: Secondary | ICD-10-CM | POA: Diagnosis not present

## 2016-01-16 DIAGNOSIS — I252 Old myocardial infarction: Secondary | ICD-10-CM | POA: Diagnosis not present

## 2016-01-16 DIAGNOSIS — I2510 Atherosclerotic heart disease of native coronary artery without angina pectoris: Secondary | ICD-10-CM | POA: Diagnosis not present

## 2016-01-16 DIAGNOSIS — Y999 Unspecified external cause status: Secondary | ICD-10-CM | POA: Insufficient documentation

## 2016-01-16 DIAGNOSIS — S0101XA Laceration without foreign body of scalp, initial encounter: Secondary | ICD-10-CM | POA: Insufficient documentation

## 2016-01-16 DIAGNOSIS — I129 Hypertensive chronic kidney disease with stage 1 through stage 4 chronic kidney disease, or unspecified chronic kidney disease: Secondary | ICD-10-CM | POA: Diagnosis not present

## 2016-01-16 DIAGNOSIS — Y92009 Unspecified place in unspecified non-institutional (private) residence as the place of occurrence of the external cause: Secondary | ICD-10-CM | POA: Diagnosis not present

## 2016-01-16 DIAGNOSIS — W19XXXA Unspecified fall, initial encounter: Secondary | ICD-10-CM

## 2016-01-16 DIAGNOSIS — Z7901 Long term (current) use of anticoagulants: Secondary | ICD-10-CM | POA: Insufficient documentation

## 2016-01-16 DIAGNOSIS — T148XXA Other injury of unspecified body region, initial encounter: Secondary | ICD-10-CM

## 2016-01-16 DIAGNOSIS — S51011A Laceration without foreign body of right elbow, initial encounter: Secondary | ICD-10-CM | POA: Diagnosis not present

## 2016-01-16 DIAGNOSIS — Z8673 Personal history of transient ischemic attack (TIA), and cerebral infarction without residual deficits: Secondary | ICD-10-CM | POA: Diagnosis not present

## 2016-01-16 DIAGNOSIS — Z79899 Other long term (current) drug therapy: Secondary | ICD-10-CM | POA: Diagnosis not present

## 2016-01-16 DIAGNOSIS — R791 Abnormal coagulation profile: Secondary | ICD-10-CM | POA: Diagnosis not present

## 2016-01-16 DIAGNOSIS — R51 Headache: Secondary | ICD-10-CM | POA: Diagnosis not present

## 2016-01-16 DIAGNOSIS — E039 Hypothyroidism, unspecified: Secondary | ICD-10-CM | POA: Diagnosis not present

## 2016-01-16 DIAGNOSIS — W1839XA Other fall on same level, initial encounter: Secondary | ICD-10-CM | POA: Diagnosis not present

## 2016-01-16 DIAGNOSIS — Y9389 Activity, other specified: Secondary | ICD-10-CM | POA: Insufficient documentation

## 2016-01-16 MED ORDER — LIDOCAINE HCL 2 % IJ SOLN
10.0000 mL | Freq: Once | INTRAMUSCULAR | Status: AC
Start: 2016-01-16 — End: 2016-01-16
  Administered 2016-01-16: 200 mg
  Filled 2016-01-16: qty 20

## 2016-01-16 MED ORDER — ACETAMINOPHEN 500 MG PO TABS
1000.0000 mg | ORAL_TABLET | Freq: Once | ORAL | Status: AC
  Administered 2016-01-16: 1000 mg via ORAL
  Filled 2016-01-16: qty 2

## 2016-01-16 MED ORDER — LIDOCAINE-EPINEPHRINE-TETRACAINE (LET) SOLUTION
3.0000 mL | Freq: Once | NASAL | Status: AC
  Administered 2016-01-16: 3 mL via TOPICAL
  Filled 2016-01-16: qty 3

## 2016-01-16 MED ORDER — TETANUS-DIPHTH-ACELL PERTUSSIS 5-2.5-18.5 LF-MCG/0.5 IM SUSP
0.5000 mL | Freq: Once | INTRAMUSCULAR | Status: AC
  Administered 2016-01-16: 0.5 mL via INTRAMUSCULAR
  Filled 2016-01-16: qty 0.5

## 2016-01-16 NOTE — ED Provider Notes (Signed)
[facility]-EMERGENCY DEPT Provider Note   CSN: [id number] Arrival date & time: 01/16/16  2225    History   Chief Complaint Chief Complaint  Patient presents with  . Fall  . Head Laceration    HPI Katherine Cuevas is a 82 y.o. female.  82 year old female with a history of coronary artery disease, chronic kidney disease, hypertension, and pulmonary embolus on chronic Coumadin, presents to the emergency department for evaluation of fall. Patient fell into a doorway, striking her head. Daughter reports no loss of consciousness. Fall was unwitnessed. Patient is complaining of a headache, at the site of her scalp laceration. Patient with mild pain to her right arm and elbow associated with skin tears. She complains of some mild nausea and was given 4 mg of Zofran by EMS. No medications given for pain prior to arrival. No vomiting, unilateral numbness or weakness, vision changes, or speech difficulty. Lives in home with daughter.   The history is provided by the patient. No language interpreter was used.  Fall   Head Laceration     Past Medical History:  Diagnosis Date  . Anemia   . Anginal pain (HCC)   . Arthritis   . CAD (coronary artery disease) 1972   s/p SEMI  . Chronic kidney disease   . Constipation   . Fibromyalgia   . Gout   . Gout   . Hypercholesterolemia   . Hypertension   . Hypothyroidism   . Lumbar stenosis   . Myocardial infarction   . Peripheral arterial occlusive disease (HCC)    s/p thrombectomy right popliteal and tibial arteries and  embolectomy right superficial femoral artery  . Pulmonary embolism (HCC) 1969  . Shortness of breath   . Thyroid disease   . TIA (transient ischemic attack)     Patient Active Problem List   Diagnosis Date Noted  . Gastroenteritis 07/10/2013  . Chest pain 07/09/2013  . Diarrhea 07/09/2013  . DOE (dyspnea on exertion) 07/09/2013  . Abdominal pain, acute, right upper quadrant 02/07/2011  . Nausea 02/07/2011  . HTN  (hypertension) 02/07/2011  . Gout   . Hypercholesterolemia   . Anemia   . Constipation   . Chronic kidney disease     Past Surgical History:  Procedure Laterality Date  . ABDOMINAL HYSTERECTOMY    . APPENDECTOMY    . TONSILLECTOMY    . TUBAL LIGATION    . vena cava umbrella placement secondary to pulmonary embolism 1969  1969    OB History    No data available       Home Medications    Prior to Admission medications   Medication Sig Start Date End Date Taking? Authorizing Provider  acetaminophen (TYLENOL) 500 MG tablet Take 500-1,000 mg by mouth every 6 (six) hours as needed for pain.    Historical Provider, MD  allopurinol (ZYLOPRIM) 100 MG tablet Take 100 mg by mouth daily.      Historical Provider, MD  amLODipine (NORVASC) 5 MG tablet Take 5 mg by mouth daily.      Historical Provider, MD  bacitracin ointment Apply 1 application topically 2 (two) times daily. Apply to wounds as prescribed 01/17/16   Antony MaduraKelly Teneshia Hedeen, PA-C  calcium carbonate (TUMS - DOSED IN MG ELEMENTAL CALCIUM) 500 MG chewable tablet Chew 2 tablets by mouth 2 (two) times daily as needed for indigestion or heartburn.    Historical Provider, MD  cephALEXin (KEFLEX) 500 MG capsule Take 1 capsule (500 mg total) by mouth 3 (three)  times daily. To prevent wound infection 01/17/16   Antony MaduraKelly Mayra Brahm, PA-C  cholecalciferol (VITAMIN D) 1000 UNITS tablet Take 1,000 Units by mouth daily.      Historical Provider, MD  furosemide (LASIX) 20 MG tablet Take 20 mg by mouth daily.     Historical Provider, MD  HYDROcodone-acetaminophen (NORCO/VICODIN) 5-325 MG per tablet Take 1 tablet by mouth every 4 (four) hours as needed. 01/02/14   Garlon HatchetLisa M Sanders, PA-C  levothyroxine (SYNTHROID, LEVOTHROID) 50 MCG tablet Take 50 mcg by mouth daily.      Historical Provider, MD  losartan (COZAAR) 100 MG tablet Take 100 mg by mouth daily.      Historical Provider, MD  Multiple Vitamin (MULITIVITAMIN WITH MINERALS) TABS Take 1 tablet by mouth daily.       Historical Provider, MD  nitroGLYCERIN (NITROSTAT) 0.4 MG SL tablet Place 0.4 mg under the tongue every 5 (five) minutes as needed for chest pain.     Historical Provider, MD  ondansetron (ZOFRAN-ODT) 8 MG disintegrating tablet Take 1 tablet (8 mg total) by mouth every 8 (eight) hours as needed for nausea. 07/10/13   Jerald KiefStephen K Chiu, MD  ranitidine (ZANTAC) 150 MG tablet Take 150 mg by mouth daily.    Historical Provider, MD  traMADol (ULTRAM) 50 MG tablet Take 100 mg by mouth 2 (two) times daily.     Historical Provider, MD  warfarin (COUMADIN) 1 MG tablet Take 1 mg by mouth every Tuesday. Only take on Tuesday with a 4mg  tablet for a total of 5mg     Historical Provider, MD  warfarin (COUMADIN) 4 MG tablet Take 4 mg by mouth every evening.     Historical Provider, MD    Family History No family history on file.  Social History Social History  Substance Use Topics  . Smoking status: Never Smoker  . Smokeless tobacco: Never Used  . Alcohol use No     Allergies   Celebrex [celecoxib]; Demerol [meperidine]; Morphine and related; Codeine; and Dilaudid [hydromorphone hcl]   Review of Systems Review of Systems Ten systems reviewed and are negative for acute change, except as noted in the HPI.    Physical Exam Updated Vital Signs BP 160/75   Pulse 76   Temp 97.9 F (36.6 C) (Oral)   Resp 13   Ht 5\' 4"  (1.626 m)   Wt 63 kg   SpO2 94%   BMI 23.86 kg/m   Physical Exam  Constitutional: She is oriented to person, place, and time. She appears well-developed and well-nourished. No distress.  Nontoxic appearing  HENT:  Head: Normocephalic. Head is with laceration. Head is without raccoon's eyes and without Battle's sign.    Mouth/Throat: Oropharynx is clear and moist.  Scalp laceration and overlying hematoma. Bleeding controlled. No palpable skull instability. No battle's sign or raccoon's eyes.  Eyes: Conjunctivae and EOM are normal. Pupils are equal, round, and reactive to  light. No scleral icterus.  Neck:  Immobilized with towel.  Cardiovascular: Normal rate, regular rhythm and intact distal pulses.   Pulmonary/Chest: Effort normal. No respiratory distress. She has no wheezes.  Respirations even and unlabored  Musculoskeletal: Normal range of motion.  Neurological: She is alert and oriented to person, place, and time. No cranial nerve deficit. She exhibits normal muscle tone. Coordination normal.  GCS 15. Speech is goal oriented. No focal neurologic deficits appreciated.  Skin: Skin is warm and dry. No rash noted. She is not diaphoretic. No erythema. No pallor.  Psychiatric: She has  a normal mood and affect. Her behavior is normal.  Nursing note and vitals reviewed.    ED Treatments / Results  Labs (all labs ordered are listed, but only abnormal results are displayed) Labs Reviewed  PROTIME-INR - Abnormal; Notable for the following:       Result Value   Prothrombin Time 15.4 (*)    All other components within normal limits  BASIC METABOLIC PANEL - Abnormal; Notable for the following:    Glucose, Bld 107 (*)    BUN 37 (*)    Creatinine, Ser 1.36 (*)    GFR calc non Af Amer 32 (*)    GFR calc Af Amer 37 (*)    All other components within normal limits  CBC    EKG  EKG Interpretation None       Radiology Ct Head Wo Contrast  Result Date: 01/17/2016 CLINICAL DATA:  Unwitnessed fall at home. RIGHT periorbital laceration. On blood thinners. History of hypertension, hypercholesterolemia. EXAM: CT HEAD WITHOUT CONTRAST CT CERVICAL SPINE WITHOUT CONTRAST TECHNIQUE: Multidetector CT imaging of the head and cervical spine was performed following the standard protocol without intravenous contrast. Multiplanar CT image reconstructions of the cervical spine were also generated. COMPARISON:  CT HEAD April 09, 2012 FINDINGS: CT HEAD FINDINGS BRAIN: The ventricles and sulci are normal for age. No intraparenchymal hemorrhage, mass effect nor midline shift.  Patchy supratentorial white matter hypodensities less than expected for patient's age, though non-specific are most compatible with chronic small vessel ischemic disease. No acute large vascular territory infarcts. No abnormal extra-axial fluid collections. Basal cisterns are patent. VASCULAR: Mild calcific atherosclerosis of the carotid siphons. SKULL: No skull fracture. Moderate RIGHT frontal scalp hematoma and laceration with minimal subcutaneous gas. No radiopaque foreign bodies. SINUSES/ORBITS: The trace paranasal sinus mucosal thickening. Mastoid air cells are well aerated. Status post bilateral ocular lens implants. The included ocular globes and orbital contents are non-suspicious. OTHER: None. CT CERVICAL SPINE FINDINGS ALIGNMENT: Maintained lordosis. Vertebral bodies in alignment. SKULL BASE AND VERTEBRAE: Cervical vertebral bodies and posterior elements are intact. Moderate C5-6 disc height loss. Multilevel moderate to severe facet arthropathy. C2-3 facet fusion on the LEFT, on degenerative basis. No destructive bony lesions. C1-2 articulation maintained, moderate facet arthropathy. SOFT TISSUES AND SPINAL CANAL: Moderate calcific atherosclerosis of the carotid bulbs. Status post LEFT thyroidectomy. DISC LEVELS: No significant osseous canal stenosis or neural foraminal narrowing. UPPER CHEST: Lung apices are clear. OTHER: None. IMPRESSION: CT HEAD: Moderate RIGHT frontal scalp hematoma and laceration. No skull fracture. Negative CT HEAD for age. CT CERVICAL SPINE: No acute fracture or malalignment. Electronically Signed   By: Awilda Metro M.D.   On: 01/17/2016 00:39   Ct Cervical Spine Wo Contrast  Result Date: 01/17/2016 CLINICAL DATA:  Unwitnessed fall at home. RIGHT periorbital laceration. On blood thinners. History of hypertension, hypercholesterolemia. EXAM: CT HEAD WITHOUT CONTRAST CT CERVICAL SPINE WITHOUT CONTRAST TECHNIQUE: Multidetector CT imaging of the head and cervical spine was  performed following the standard protocol without intravenous contrast. Multiplanar CT image reconstructions of the cervical spine were also generated. COMPARISON:  CT HEAD April 09, 2012 FINDINGS: CT HEAD FINDINGS BRAIN: The ventricles and sulci are normal for age. No intraparenchymal hemorrhage, mass effect nor midline shift. Patchy supratentorial white matter hypodensities less than expected for patient's age, though non-specific are most compatible with chronic small vessel ischemic disease. No acute large vascular territory infarcts. No abnormal extra-axial fluid collections. Basal cisterns are patent. VASCULAR: Mild calcific atherosclerosis of the carotid  siphons. SKULL: No skull fracture. Moderate RIGHT frontal scalp hematoma and laceration with minimal subcutaneous gas. No radiopaque foreign bodies. SINUSES/ORBITS: The trace paranasal sinus mucosal thickening. Mastoid air cells are well aerated. Status post bilateral ocular lens implants. The included ocular globes and orbital contents are non-suspicious. OTHER: None. CT CERVICAL SPINE FINDINGS ALIGNMENT: Maintained lordosis. Vertebral bodies in alignment. SKULL BASE AND VERTEBRAE: Cervical vertebral bodies and posterior elements are intact. Moderate C5-6 disc height loss. Multilevel moderate to severe facet arthropathy. C2-3 facet fusion on the LEFT, on degenerative basis. No destructive bony lesions. C1-2 articulation maintained, moderate facet arthropathy. SOFT TISSUES AND SPINAL CANAL: Moderate calcific atherosclerosis of the carotid bulbs. Status post LEFT thyroidectomy. DISC LEVELS: No significant osseous canal stenosis or neural foraminal narrowing. UPPER CHEST: Lung apices are clear. OTHER: None. IMPRESSION: CT HEAD: Moderate RIGHT frontal scalp hematoma and laceration. No skull fracture. Negative CT HEAD for age. CT CERVICAL SPINE: No acute fracture or malalignment. Electronically Signed   By: Awilda Metro M.D.   On: 01/17/2016 00:39     Procedures Procedures (including critical care time)  Medications Ordered in ED Medications  Tdap (BOOSTRIX) injection 0.5 mL (0.5 mLs Intramuscular Given 01/16/16 2344)  acetaminophen (TYLENOL) tablet 1,000 mg (1,000 mg Oral Given 01/16/16 2344)  lidocaine-EPINEPHrine-tetracaine (LET) solution (3 mLs Topical Given 01/16/16 2344)  lidocaine (XYLOCAINE) 2 % (with pres) injection 200 mg (200 mg Infiltration Given 01/16/16 2344)  ondansetron (ZOFRAN) injection 4 mg (4 mg Intravenous Given 01/17/16 0120)    LACERATION REPAIR Performed by: Antony Madura Authorized by: Antony Madura Consent: Verbal consent obtained. Risks and benefits: risks, benefits and alternatives were discussed Consent given by: patient Patient identity confirmed: provided demographic data Prepped and Draped in normal sterile fashion Wound explored  Laceration Location: R parietal scalp  Laceration Length: 5cm  No Foreign Bodies seen or palpated  Anesthesia: local infiltration  Local anesthetic: lidocaine 2% without epinephrine  Anesthetic total: 8 ml  Irrigation method: syringe Amount of cleaning: standard  Skin closure: 3-0 ethilon  Number of sutures: 4  Technique: simple interrupted  Patient tolerance: Patient tolerated the procedure well with no immediate complications.   Initial Impression / Assessment and Plan / ED Course  I have reviewed the triage vital signs and the nursing notes.  Pertinent labs & imaging results that were available during my care of the patient were reviewed by me and considered in my medical decision making (see chart for details).  Clinical Course     82 year old female presents to the emergency department for evaluation of scalp laceration secondary to a fall at home. No reported loss of consciousness.. Patient has a nonfocal neurologic exam. No skull instability, battle sign, or raccoon's eyes. There is obvious laceration to the right parietal scalp, anteriorly.  Patient is on chronic Coumadin, but is subtherapeutic today. She states that she missed her dose this evening and her doctor recently increased her dose for a subtherapeutic INR. Have recommended that she follow-up with her doctor regarding this value, for repeat testing.  CT head and cervical spine obtained. These show no evidence of acute traumatic injury. No skull fracture. Laceration repaired in the emergency department. Patient tolerated this well. Her tetanus was updated today. Tylenol advised for pain control and suture removal recommended in 10 days. Return precautions discussed and provided. Patient discharged in the care of her daughter. Patient and family with no unaddressed concerns.   Final Clinical Impressions(s) / ED Diagnoses   Final diagnoses:  Laceration of scalp,  initial encounter  Fall in home, initial encounter  Multiple skin tears  Subtherapeutic international normalized ratio (INR)    New Prescriptions New Prescriptions   BACITRACIN OINTMENT    Apply 1 application topically 2 (two) times daily. Apply to wounds as prescribed   CEPHALEXIN (KEFLEX) 500 MG CAPSULE    Take 1 capsule (500 mg total) by mouth 3 (three) times daily. To prevent wound infection     Antony Madura, PA-C 01/17/16 0305    Dione Booze, MD 01/17/16 2249

## 2016-01-16 NOTE — ED Triage Notes (Signed)
Pt BIB [facility] from home where pt had a unwitnessed fall. Pt states she remembers getting out of the chair but does not remember falling. Skin tare to right knee and right forearm. Pt reports tenderness in right arm near elbow. Pt has a full thickness jagged laceration to upper right head above right eye. Pt is on blood thinners. Pt received 4 of Zofran in field for nausea. Placed pt on 2L for c/o SOB.

## 2016-01-17 ENCOUNTER — Other Ambulatory Visit: Payer: Self-pay

## 2016-01-17 LAB — CBC
HCT: 41.9 % (ref 36.0–46.0)
HEMOGLOBIN: 14.1 g/dL (ref 12.0–15.0)
MCH: 29.9 pg (ref 26.0–34.0)
MCHC: 33.7 g/dL (ref 30.0–36.0)
MCV: 89 fL (ref 78.0–100.0)
Platelets: 306 10*3/uL (ref 150–400)
RBC: 4.71 MIL/uL (ref 3.87–5.11)
RDW: 14 % (ref 11.5–15.5)
WBC: 10 10*3/uL (ref 4.0–10.5)

## 2016-01-17 LAB — BASIC METABOLIC PANEL
ANION GAP: 11 (ref 5–15)
BUN: 37 mg/dL — ABNORMAL HIGH (ref 6–20)
CALCIUM: 9.5 mg/dL (ref 8.9–10.3)
CO2: 24 mmol/L (ref 22–32)
Chloride: 103 mmol/L (ref 101–111)
Creatinine, Ser: 1.36 mg/dL — ABNORMAL HIGH (ref 0.44–1.00)
GFR calc non Af Amer: 32 mL/min — ABNORMAL LOW (ref >60)
GFR, EST AFRICAN AMERICAN: 37 mL/min — AB (ref >60)
Glucose, Bld: 107 mg/dL — ABNORMAL HIGH (ref 65–99)
Potassium: 4.3 mmol/L (ref 3.5–5.1)
Sodium: 138 mmol/L (ref 135–145)

## 2016-01-17 LAB — PROTIME-INR
INR: 1.21
Prothrombin Time: 15.4 seconds — ABNORMAL HIGH (ref 11.4–15.2)

## 2016-01-17 MED ORDER — BACITRACIN ZINC 500 UNIT/GM EX OINT: 1.0000 "application " | TOPICAL_OINTMENT | Freq: Two times a day (BID) | CUTANEOUS | 0 refills | Status: AC

## 2016-01-17 MED ORDER — ONDANSETRON HCL 4 MG/2ML IJ SOLN
4.0000 mg | Freq: Once | INTRAMUSCULAR | Status: AC
  Administered 2016-01-17: 4 mg via INTRAVENOUS
  Filled 2016-01-17: qty 2

## 2016-01-17 MED ORDER — CEPHALEXIN 500 MG PO CAPS: 500.0000 mg | ORAL_CAPSULE | Freq: Three times a day (TID) | ORAL | 0 refills | Status: AC

## 2016-01-17 NOTE — ED Notes (Signed)
Dressing applied to forehead, right arm, and right leg.  Tolerated well.  [facility] notified of need for transport.

## 2016-01-17 NOTE — ED Notes (Signed)
Towel roll removed with okay from PA.  Nausea reported to be better.  Suture cart taken to the bedside.

## 2016-01-17 NOTE — Discharge Instructions (Signed)
Take Tylenol as needed for pain control. Apply bacitracin to your wounds to prevent infection. We also recommend that you take Keflex for infection prevention. Have your stitches removed in 10 days by her primary care doctor. Have your INR rechecked by your doctor as well as your level was low. They return to the emergency department for new or concerning symptoms.

## 2020-02-13 DIAGNOSIS — M545 Low back pain, unspecified: Secondary | ICD-10-CM | POA: Diagnosis not present

## 2020-03-21 DIAGNOSIS — I10 Essential (primary) hypertension: Secondary | ICD-10-CM | POA: Diagnosis not present

## 2020-03-21 DIAGNOSIS — E039 Hypothyroidism, unspecified: Secondary | ICD-10-CM | POA: Diagnosis not present

## 2020-03-21 DIAGNOSIS — I208 Other forms of angina pectoris: Secondary | ICD-10-CM | POA: Diagnosis not present

## 2020-03-21 DIAGNOSIS — E7800 Pure hypercholesterolemia, unspecified: Secondary | ICD-10-CM | POA: Diagnosis not present

## 2020-03-21 DIAGNOSIS — I25119 Atherosclerotic heart disease of native coronary artery with unspecified angina pectoris: Secondary | ICD-10-CM | POA: Diagnosis not present

## 2020-03-21 DIAGNOSIS — N183 Chronic kidney disease, stage 3 unspecified: Secondary | ICD-10-CM | POA: Diagnosis not present

## 2020-05-04 DIAGNOSIS — I208 Other forms of angina pectoris: Secondary | ICD-10-CM | POA: Diagnosis not present

## 2020-05-04 DIAGNOSIS — E039 Hypothyroidism, unspecified: Secondary | ICD-10-CM | POA: Diagnosis not present

## 2020-05-04 DIAGNOSIS — E7800 Pure hypercholesterolemia, unspecified: Secondary | ICD-10-CM | POA: Diagnosis not present

## 2020-05-04 DIAGNOSIS — I25119 Atherosclerotic heart disease of native coronary artery with unspecified angina pectoris: Secondary | ICD-10-CM | POA: Diagnosis not present

## 2020-05-04 DIAGNOSIS — N183 Chronic kidney disease, stage 3 unspecified: Secondary | ICD-10-CM | POA: Diagnosis not present

## 2020-05-04 DIAGNOSIS — I10 Essential (primary) hypertension: Secondary | ICD-10-CM | POA: Diagnosis not present

## 2020-07-20 DIAGNOSIS — E7800 Pure hypercholesterolemia, unspecified: Secondary | ICD-10-CM | POA: Diagnosis not present

## 2020-07-20 DIAGNOSIS — I10 Essential (primary) hypertension: Secondary | ICD-10-CM | POA: Diagnosis not present

## 2020-07-20 DIAGNOSIS — E039 Hypothyroidism, unspecified: Secondary | ICD-10-CM | POA: Diagnosis not present

## 2020-07-20 DIAGNOSIS — N183 Chronic kidney disease, stage 3 unspecified: Secondary | ICD-10-CM | POA: Diagnosis not present

## 2020-07-20 DIAGNOSIS — I208 Other forms of angina pectoris: Secondary | ICD-10-CM | POA: Diagnosis not present

## 2020-07-20 DIAGNOSIS — I25119 Atherosclerotic heart disease of native coronary artery with unspecified angina pectoris: Secondary | ICD-10-CM | POA: Diagnosis not present

## 2020-08-16 DIAGNOSIS — M109 Gout, unspecified: Secondary | ICD-10-CM | POA: Diagnosis not present

## 2020-08-16 DIAGNOSIS — N183 Chronic kidney disease, stage 3 unspecified: Secondary | ICD-10-CM | POA: Diagnosis not present

## 2020-08-16 DIAGNOSIS — I10 Essential (primary) hypertension: Secondary | ICD-10-CM | POA: Diagnosis not present

## 2020-08-16 DIAGNOSIS — Z0000 Encounter for general adult medical examination without abnormal findings: Secondary | ICD-10-CM | POA: Diagnosis not present

## 2020-08-16 DIAGNOSIS — I208 Other forms of angina pectoris: Secondary | ICD-10-CM | POA: Diagnosis not present

## 2020-08-16 DIAGNOSIS — M1990 Unspecified osteoarthritis, unspecified site: Secondary | ICD-10-CM | POA: Diagnosis not present

## 2020-08-16 DIAGNOSIS — D6869 Other thrombophilia: Secondary | ICD-10-CM | POA: Diagnosis not present

## 2020-08-16 DIAGNOSIS — M797 Fibromyalgia: Secondary | ICD-10-CM | POA: Diagnosis not present

## 2020-08-16 DIAGNOSIS — E7800 Pure hypercholesterolemia, unspecified: Secondary | ICD-10-CM | POA: Diagnosis not present

## 2020-08-16 DIAGNOSIS — E039 Hypothyroidism, unspecified: Secondary | ICD-10-CM | POA: Diagnosis not present

## 2020-08-16 DIAGNOSIS — R110 Nausea: Secondary | ICD-10-CM | POA: Diagnosis not present

## 2020-10-13 DIAGNOSIS — E039 Hypothyroidism, unspecified: Secondary | ICD-10-CM | POA: Diagnosis not present

## 2020-10-13 DIAGNOSIS — M109 Gout, unspecified: Secondary | ICD-10-CM | POA: Diagnosis not present

## 2020-10-13 DIAGNOSIS — E7800 Pure hypercholesterolemia, unspecified: Secondary | ICD-10-CM | POA: Diagnosis not present

## 2020-10-13 DIAGNOSIS — Z23 Encounter for immunization: Secondary | ICD-10-CM | POA: Diagnosis not present

## 2020-10-13 DIAGNOSIS — I10 Essential (primary) hypertension: Secondary | ICD-10-CM | POA: Diagnosis not present

## 2021-01-18 ENCOUNTER — Emergency Department (HOSPITAL_COMMUNITY)

## 2021-01-18 ENCOUNTER — Emergency Department (HOSPITAL_COMMUNITY)
Admission: EM | Admit: 2021-01-18 | Discharge: 2021-01-18 | Disposition: A | Discharge status: Home / Self Care | Attending: Emergency Medicine | Admitting: Emergency Medicine

## 2021-01-18 DIAGNOSIS — S43001A Unspecified subluxation of right shoulder joint, initial encounter: Secondary | ICD-10-CM | POA: Diagnosis not present

## 2021-01-18 DIAGNOSIS — E039 Hypothyroidism, unspecified: Secondary | ICD-10-CM | POA: Insufficient documentation

## 2021-01-18 DIAGNOSIS — M958 Other specified acquired deformities of musculoskeletal system: Secondary | ICD-10-CM | POA: Diagnosis not present

## 2021-01-18 DIAGNOSIS — S40011A Contusion of right shoulder, initial encounter: Secondary | ICD-10-CM

## 2021-01-18 DIAGNOSIS — Z79899 Other long term (current) drug therapy: Secondary | ICD-10-CM | POA: Diagnosis not present

## 2021-01-18 DIAGNOSIS — X58XXXA Exposure to other specified factors, initial encounter: Secondary | ICD-10-CM | POA: Diagnosis not present

## 2021-01-18 DIAGNOSIS — I2510 Atherosclerotic heart disease of native coronary artery without angina pectoris: Secondary | ICD-10-CM | POA: Diagnosis not present

## 2021-01-18 DIAGNOSIS — M2550 Pain in unspecified joint: Secondary | ICD-10-CM | POA: Diagnosis not present

## 2021-01-18 DIAGNOSIS — S4991XA Unspecified injury of right shoulder and upper arm, initial encounter: Secondary | ICD-10-CM | POA: Diagnosis present

## 2021-01-18 DIAGNOSIS — R296 Repeated falls: Secondary | ICD-10-CM | POA: Diagnosis not present

## 2021-01-18 DIAGNOSIS — I499 Cardiac arrhythmia, unspecified: Secondary | ICD-10-CM | POA: Diagnosis not present

## 2021-01-18 DIAGNOSIS — N189 Chronic kidney disease, unspecified: Secondary | ICD-10-CM | POA: Diagnosis not present

## 2021-01-18 DIAGNOSIS — Z7401 Bed confinement status: Secondary | ICD-10-CM | POA: Diagnosis not present

## 2021-01-18 DIAGNOSIS — Z743 Need for continuous supervision: Secondary | ICD-10-CM | POA: Diagnosis not present

## 2021-01-18 DIAGNOSIS — R6889 Other general symptoms and signs: Secondary | ICD-10-CM | POA: Diagnosis not present

## 2021-01-18 DIAGNOSIS — R609 Edema, unspecified: Secondary | ICD-10-CM | POA: Diagnosis not present

## 2021-01-18 DIAGNOSIS — I6381 Other cerebral infarction due to occlusion or stenosis of small artery: Secondary | ICD-10-CM | POA: Diagnosis not present

## 2021-01-18 DIAGNOSIS — R519 Headache, unspecified: Secondary | ICD-10-CM | POA: Diagnosis not present

## 2021-01-18 DIAGNOSIS — G319 Degenerative disease of nervous system, unspecified: Secondary | ICD-10-CM | POA: Diagnosis not present

## 2021-01-18 DIAGNOSIS — R531 Weakness: Secondary | ICD-10-CM | POA: Diagnosis not present

## 2021-01-18 DIAGNOSIS — I129 Hypertensive chronic kidney disease with stage 1 through stage 4 chronic kidney disease, or unspecified chronic kidney disease: Secondary | ICD-10-CM | POA: Insufficient documentation

## 2021-01-18 DIAGNOSIS — M19011 Primary osteoarthritis, right shoulder: Secondary | ICD-10-CM | POA: Diagnosis not present

## 2021-01-18 LAB — CBC WITH DIFFERENTIAL/PLATELET
Abs Immature Granulocytes: 0.13 10*3/uL — ABNORMAL HIGH (ref 0.00–0.07)
Basophils Absolute: 0 10*3/uL (ref 0.0–0.1)
Basophils Relative: 0 %
Eosinophils Absolute: 0.2 10*3/uL (ref 0.0–0.5)
Eosinophils Relative: 2 %
HCT: 33.1 % — ABNORMAL LOW (ref 36.0–46.0)
Hemoglobin: 11.3 g/dL — ABNORMAL LOW (ref 12.0–15.0)
Immature Granulocytes: 1 %
Lymphocytes Relative: 10 %
Lymphs Abs: 1.2 10*3/uL (ref 0.7–4.0)
MCH: 33.9 pg (ref 26.0–34.0)
MCHC: 34.1 g/dL (ref 30.0–36.0)
MCV: 99.4 fL (ref 80.0–100.0)
Monocytes Absolute: 1 10*3/uL (ref 0.1–1.0)
Monocytes Relative: 9 %
Neutro Abs: 9 10*3/uL — ABNORMAL HIGH (ref 1.7–7.7)
Neutrophils Relative %: 78 %
Platelets: 451 10*3/uL — ABNORMAL HIGH (ref 150–400)
RBC: 3.33 MIL/uL — ABNORMAL LOW (ref 3.87–5.11)
RDW: 14.7 % (ref 11.5–15.5)
WBC: 11.6 10*3/uL — ABNORMAL HIGH (ref 4.0–10.5)
nRBC: 0 % (ref 0.0–0.2)

## 2021-01-18 LAB — BASIC METABOLIC PANEL
Anion gap: 7 (ref 5–15)
BUN: 33 mg/dL — ABNORMAL HIGH (ref 8–23)
CO2: 23 mmol/L (ref 22–32)
Calcium: 8.8 mg/dL — ABNORMAL LOW (ref 8.9–10.3)
Chloride: 101 mmol/L (ref 98–111)
Creatinine, Ser: 1.5 mg/dL — ABNORMAL HIGH (ref 0.44–1.00)
GFR, Estimated: 31 mL/min — ABNORMAL LOW (ref >60)
Glucose, Bld: 108 mg/dL — ABNORMAL HIGH (ref 70–99)
Potassium: 3.9 mmol/L (ref 3.5–5.1)
Sodium: 131 mmol/L — ABNORMAL LOW (ref 135–145)

## 2021-01-18 NOTE — ED Provider Notes (Signed)
[facility] Provider Note   CSN: [id number] Arrival date & time: 01/18/21  0129     History No chief complaint on file.   Katherine Cuevas is a 87 y.o. female.  Patient is a 87 year old female with a history of CAD, CKD, hypertension, PAD, prior PE who is presenting today from home due to pain and discomfort of her right shoulder.  There is reported that patient lives at home by her self.  She called EMS tonight because she reported that her right arm was hurting..  Patient is noted to have bruising and swelling to the arm but reports not remembering having any falls.  EMS noticed a obvious deformity to the right shoulder and she was given 200 mcg of fentanyl in route to the emergency room.  Here patient reports her right arm hurts but she is moving it around fairly regularly.  She has no complaints of pain in her legs, chest pain, shortness of breath or abdominal pain.  She denies hitting her head.  The history is provided by the patient.      Past Medical History:  Diagnosis Date   Anemia    Anginal pain (HCC)    Arthritis    CAD (coronary artery disease) 1972   s/p SEMI   Chronic kidney disease    Constipation    Fibromyalgia    Gout    Gout    Hypercholesterolemia    Hypertension    Hypothyroidism    Lumbar stenosis    Myocardial infarction [facility])    Peripheral arterial occlusive disease (HCC)    s/p thrombectomy right popliteal and tibial arteries and  embolectomy right superficial femoral artery   Pulmonary embolism (HCC) 1969   Shortness of breath    Thyroid disease    TIA (transient ischemic attack)     Patient Active Problem List   Diagnosis Date Noted   Gastroenteritis 07/10/2013   Chest pain 07/09/2013   Diarrhea 07/09/2013   DOE (dyspnea on exertion) 07/09/2013   Abdominal pain, acute, right upper quadrant 02/07/2011   Nausea 02/07/2011   HTN (hypertension) 02/07/2011   Gout    Hypercholesterolemia    Anemia     Constipation    Chronic kidney disease     Past Surgical History:  Procedure Laterality Date   ABDOMINAL HYSTERECTOMY     APPENDECTOMY     TONSILLECTOMY     TUBAL LIGATION     vena cava umbrella placement secondary to pulmonary embolism 1969  1969     OB History   No obstetric history on file.     No family history on file.  Social History   Tobacco Use   Smoking status: Never   Smokeless tobacco: Never  Substance Use Topics   Alcohol use: No   Drug use: No    Home Medications Prior to Admission medications   Medication Sig Start Date End Date Taking? Authorizing Provider  acetaminophen (TYLENOL) 500 MG tablet Take 500-1,000 mg by mouth every 6 (six) hours as needed for pain.    [provider]  allopurinol (ZYLOPRIM) 100 MG tablet Take 100 mg by mouth daily.      [provider]  amLODipine (NORVASC) 5 MG tablet Take 5 mg by mouth daily.      [provider]  bacitracin ointment Apply 1 application topically 2 (two) times daily. Apply to wounds as prescribed 01/17/16   Antony Madura, PA-C  calcium carbonate (TUMS - DOSED IN  MG ELEMENTAL CALCIUM) 500 MG chewable tablet Chew 2 tablets by mouth 2 (two) times daily as needed for indigestion or heartburn.    [provider]  cephALEXin (KEFLEX) 500 MG capsule Take 1 capsule (500 mg total) by mouth 3 (three) times daily. To prevent wound infection 01/17/16   Antony Madura, PA-C  cholecalciferol (VITAMIN D) 1000 UNITS tablet Take 1,000 Units by mouth daily.      [provider]  furosemide (LASIX) 20 MG tablet Take 20 mg by mouth daily.     [provider]  HYDROcodone-acetaminophen (NORCO/VICODIN) 5-325 MG per tablet Take 1 tablet by mouth every 4 (four) hours as needed. 01/02/14   Garlon Hatchet, PA-C  levothyroxine (SYNTHROID, LEVOTHROID) 50 MCG tablet Take 50 mcg by mouth daily.      [provider]  losartan (COZAAR) 100 MG tablet Take 100 mg by mouth daily.       [provider]  Multiple Vitamin (MULITIVITAMIN WITH MINERALS) TABS Take 1 tablet by mouth daily.      [provider]  nitroGLYCERIN (NITROSTAT) 0.4 MG SL tablet Place 0.4 mg under the tongue every 5 (five) minutes as needed for chest pain.     [provider]  ondansetron (ZOFRAN-ODT) 8 MG disintegrating tablet Take 1 tablet (8 mg total) by mouth every 8 (eight) hours as needed for nausea. 07/10/13   Jerald Kief, MD  ranitidine (ZANTAC) 150 MG tablet Take 150 mg by mouth daily.    [provider]  traMADol (ULTRAM) 50 MG tablet Take 100 mg by mouth 2 (two) times daily.     [provider]  warfarin (COUMADIN) 1 MG tablet Take 1 mg by mouth every Tuesday. Only take on Tuesday with a 4mg  tablet for a total of 5mg     [provider]  warfarin (COUMADIN) 4 MG tablet Take 4 mg by mouth every evening.     [provider]    Allergies    Celebrex [celecoxib], Demerol [meperidine], Morphine and related, Codeine, and Dilaudid [hydromorphone hcl]  Review of Systems   Review of Systems  All other systems reviewed and are negative.  Physical Exam Updated Vital Signs BP (!) 186/83 (BP Location: Left Arm)    Pulse 96    Temp 98.6 F (37 C) (Oral)    Resp 12    SpO2 96%   Physical Exam Vitals and nursing note reviewed.  Constitutional:      General: She is not in acute distress.    Appearance: She is well-developed.  HENT:     Head: Normocephalic and atraumatic.  Eyes:     Conjunctiva/sclera: Conjunctivae normal.     Pupils: Pupils are equal, round, and reactive to light.  Cardiovascular:     Rate and Rhythm: Normal rate and regular rhythm.     Heart sounds: No murmur heard. Pulmonary:     Effort: Pulmonary effort is normal. No respiratory distress.     Breath sounds: Normal breath sounds. No wheezing or rales.  Abdominal:     General: There is no distension.     Palpations: Abdomen is soft.     Tenderness: There is no  abdominal tenderness. There is no guarding or rebound.  Musculoskeletal:        General: Tenderness and signs of injury present. Normal range of motion.     Cervical back: Normal range of motion and neck supple.     Comments: Significant ecchymosis in various stages of healing  and significant swelling noted to the right shoulder with pain with range of motion.  Healing skin tears over the right elbow.  Healing ecchymosis over bilateral lower extremities.  Skin:    General: Skin is warm and dry.     Findings: No erythema or rash.  Neurological:     Mental Status: She is alert.     Comments: Patient cannot tell me who she is but she has she does not know where she is and does not know the date.  She is able to move all extremities with only minimal decreased movement on the right related to pain.  Psychiatric:        Behavior: Behavior normal.     Comments: Awake calm and cooperative    ED Results / Procedures / Treatments   Labs (all labs ordered are listed, but only abnormal results are displayed) Labs Reviewed - No data to display  EKG None  Radiology DG Shoulder Right  Result Date: 01/18/2021 CLINICAL DATA:  Multiple falls. EXAM: RIGHT SHOULDER - 2+ VIEW COMPARISON:  September 25, 2005 FINDINGS: There is no evidence of acute fracture. Superior subluxation of the right humeral head is seen with respect to the right glenoid. Moderate severity degenerative changes are seen involving the right acromioclavicular joint and right glenohumeral articulation. Radiopaque surgical clips are seen within the right axilla. IMPRESSION: 1. Superior subluxation of the right humeral head with respect to the right glenoid, suggestive of chronic rotator cuff tear. Nonemergent MRI correlation is recommended. 2. No acute osseous abnormality. Electronically Signed   By: Aram Candela M.D.   On: 01/18/2021 02:29    Procedures Procedures   Medications Ordered in ED Medications - No data to display  ED  Course  I have reviewed the triage vital signs and the nursing notes.  Pertinent labs & imaging results that were available during my care of the patient were reviewed by me and considered in my medical decision making (see chart for details).    MDM Rules/Calculators/A&P                         Patient is a 87 year old female with multiple medical problems who is presenting today from home due to pain in her right arm.  Patient has evidence of healing trauma but she reports she does not recall falling.  It does not appear that the fall happened today.  However her right shoulder is very swollen and looks slightly deformed.  She was given pain medicine in route.  Patient in the past has been on anticoagulation unclear if she has hit her head at some point.  Not sure what her baseline is she is able to answer questions but reports she does not know where she is.  X-ray shows evidence of chronic rotator cuff tear but no other acute findings.  Given history of anticoagulant use in the past, obvious signs of having trauma we will scan her head and shoulder for further evaluation.  She is hypertensive here 186/83 but otherwise vital signs are reassuring.  Unclear if she is taking her medications right now.  4:19 AM Labs are at baseline, head and shoulder imaging are negative for acute pathology.  Patient does live with her daughter and feel that she is stable for discharge home.  MDM   Amount and/or Complexity of Data Reviewed Clinical lab tests: ordered and reviewed Tests in the radiology section of CPT: ordered and reviewed Independent visualization of images,  tracings, or specimens: yes        Final Clinical Impression(s) / ED Diagnoses Final diagnoses:  Contusion of right shoulder, initial encounter    Rx / DC Orders ED Discharge Orders     None        Gwyneth Sprout, MD 01/18/21 [phone]

## 2021-01-18 NOTE — ED Triage Notes (Signed)
Patient BIB EMS from home with complaint of  right arm pain that began tonight. Pt denies any precipitating even but states upon waking tonight shoulder was swollen and bruised. EMS notes obvious right shoulder deformity. 200 mcg Fentanyl given en route, 20 g LAC

## 2021-01-18 NOTE — Discharge Instructions (Signed)
There is no broken bones in the shoulder today.  Just a really bad bruise.  All the blood work looks normal and no evidence of injury to the head.  You can use Tylenol as needed for pain and an ice pack.

## 2021-03-02 DEATH — deceased

## 2023-05-04 IMAGING — CT CT HEAD W/O CM
3 series · 16 of 47 positions shown, 19 images · non-contrast
Comparison: April 09, 2012

CLINICAL DATA: Recent falls.

EXAM:
CT HEAD WITHOUT CONTRAST
TECHNIQUE: Contiguous axial images were obtained from the base of the skull
through the vertex without intravenous contrast.

[Series 3: head wo · axial · 0.47mm/px · z∈[+1389,+1514]mm · 10 of 30 slices shown, 13 images]
[im 3/30  brain]
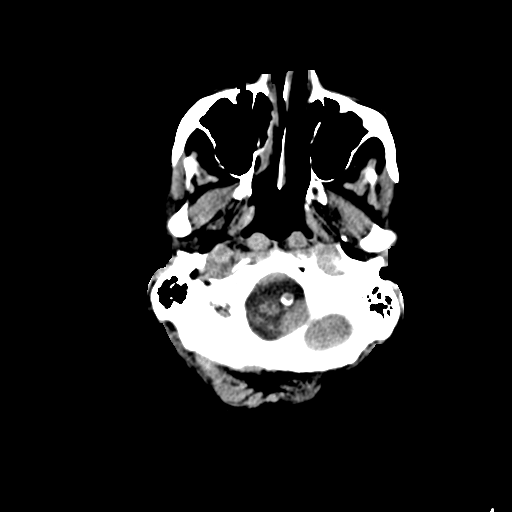
[im 3/30  bone]
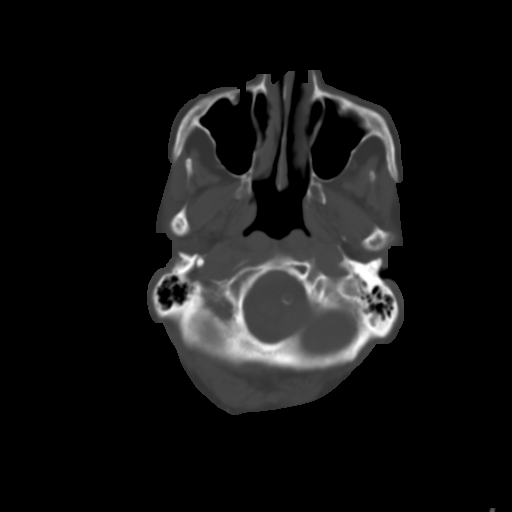
[im 6/30  brain]
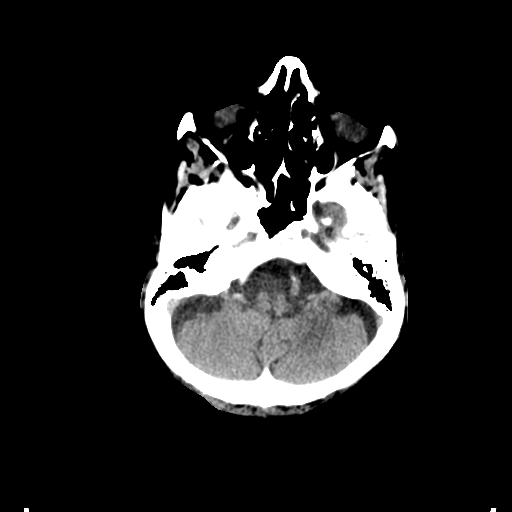
[im 9/30  brain]
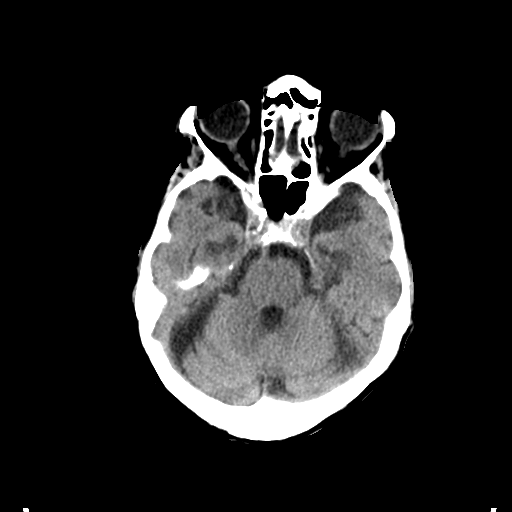
[im 11/30  brain]
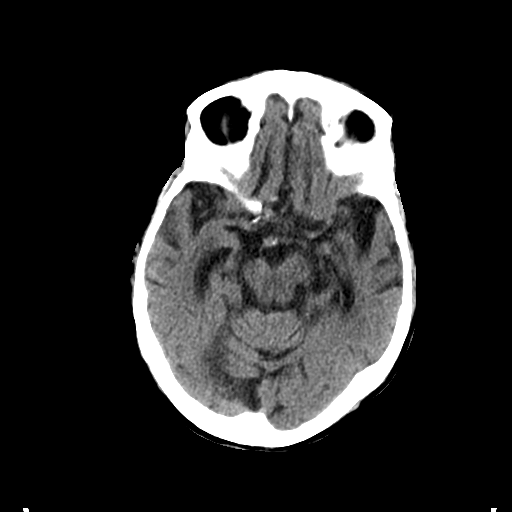
[im 14/30  brain]
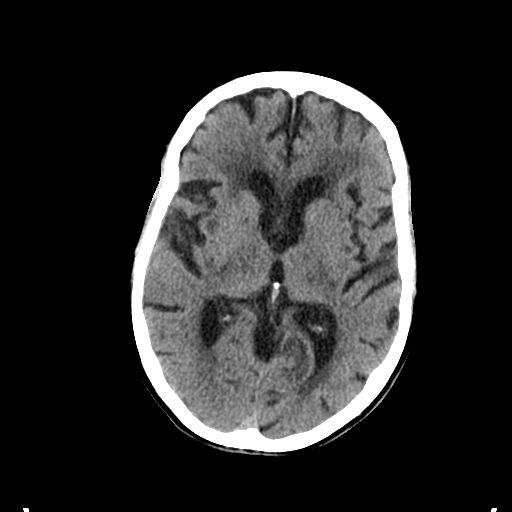
[im 14/30  bone]
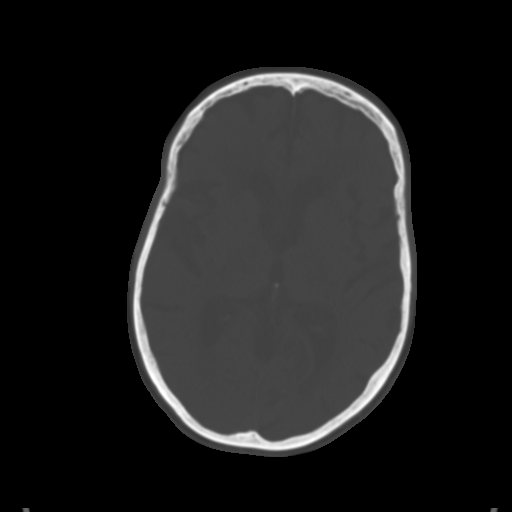
[im 17/30  brain]
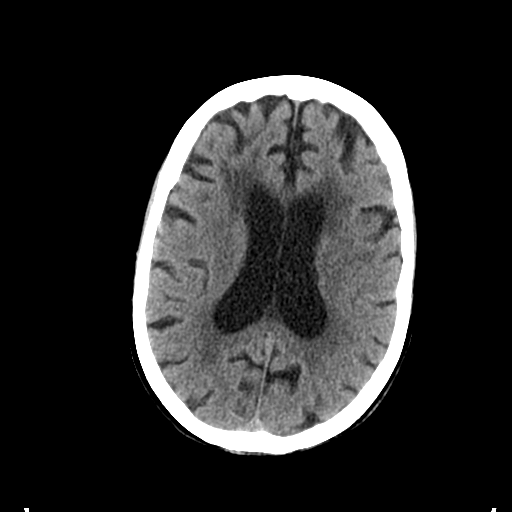
[im 20/30  brain]
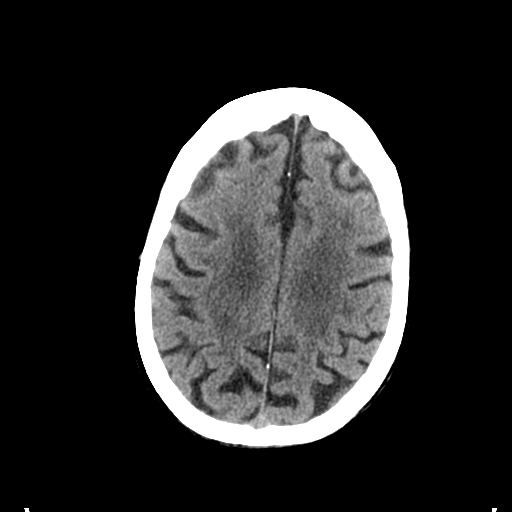
[im 23/30  brain]
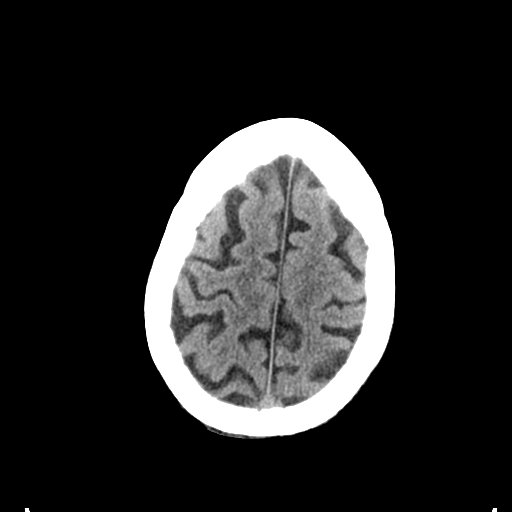
[im 25/30  brain]
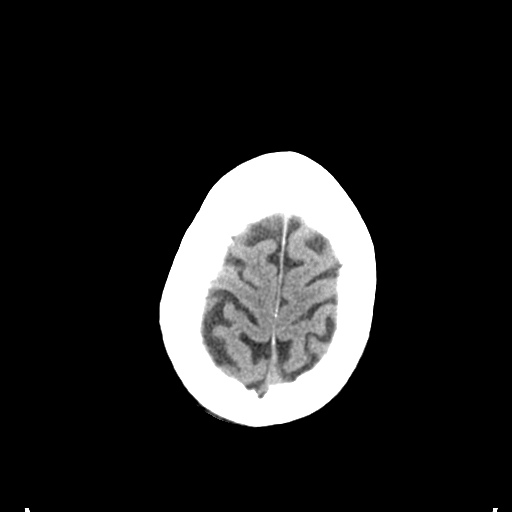
[im 25/30  bone]
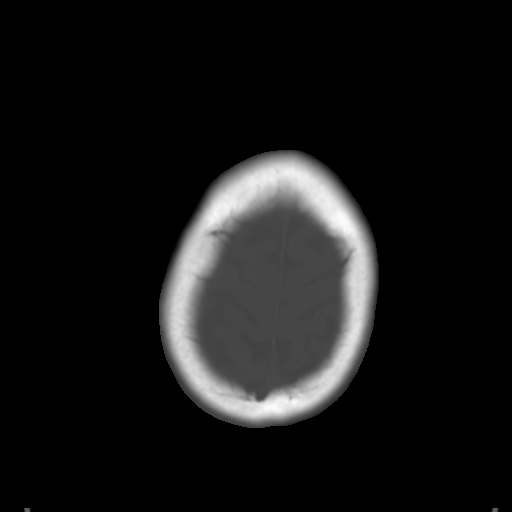
[im 28/30  brain]
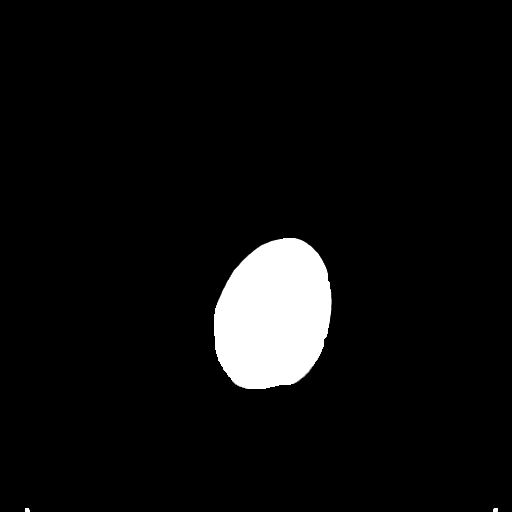

[Series 5: coronal soft tissue · coronal · 0.29mm/px · 3 of 63 slices shown]
[im 21/63  brain]
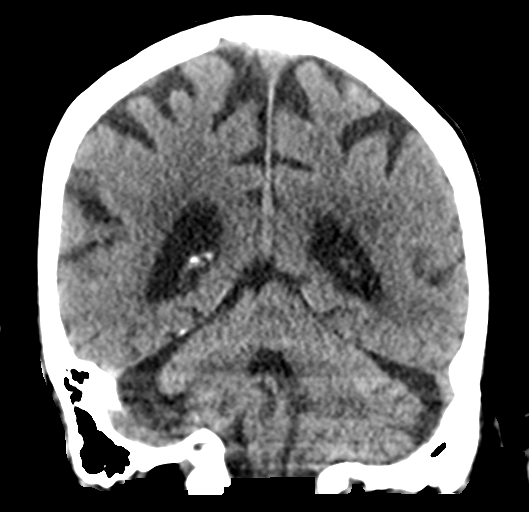
[im 28/63  brain]
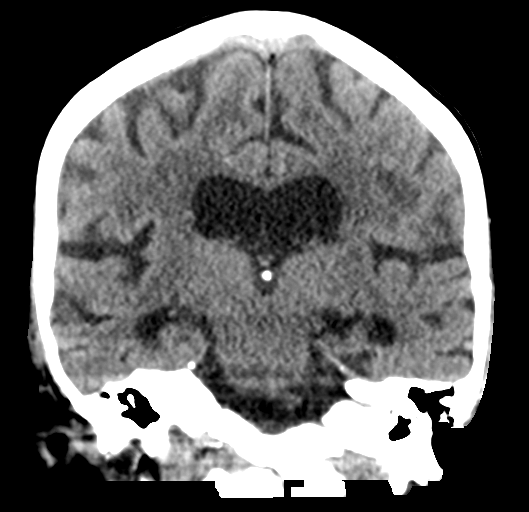
[im 35/63  brain]
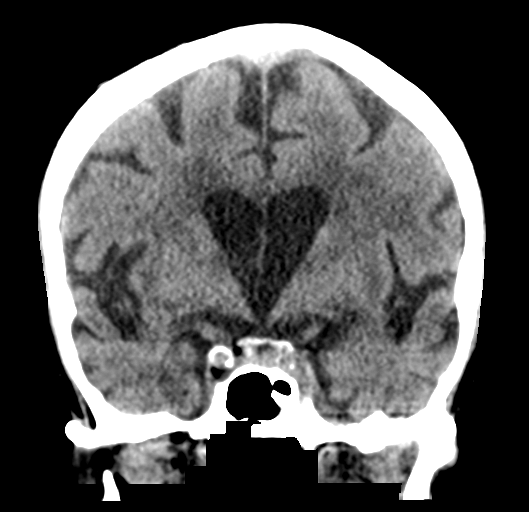

[Series 6: sagittal soft tissue · sagittal · 0.29mm/px · 3 of 46 slices shown]
[im 16/46  brain]
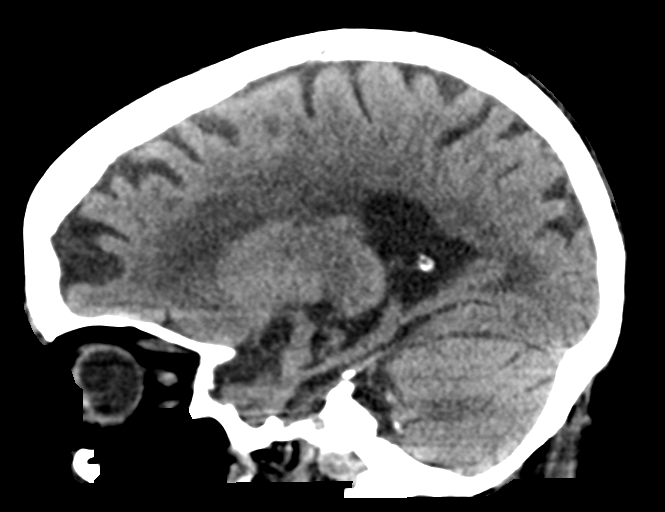
[im 23/46  brain]
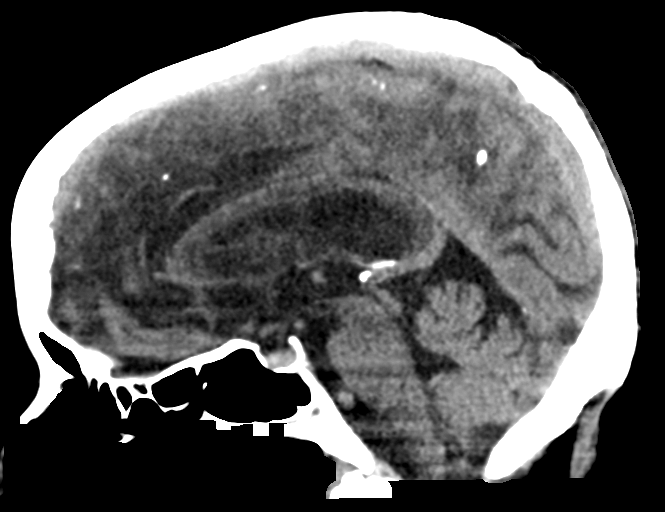
[im 31/46  brain]
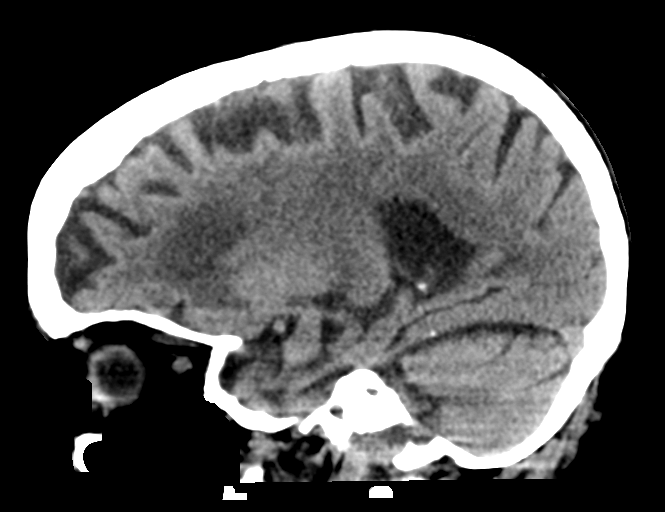

[16 of 47 positions shown; findings below may reference images not displayed]

FINDINGS: Brain: There is mild to moderate severity cerebral atrophy with
widening of the extra-axial spaces and ventricular dilatation.
There are areas of decreased attenuation within the white matter
tracts of the supratentorial brain, consistent with microvascular
disease changes.

Small chronic bilateral basal ganglia lacunar infarcts are seen.

Vascular: No hyperdense vessel or unexpected calcification.

Skull: Normal. Negative for fracture or focal lesion.

Sinuses/Orbits: No acute finding.

Other: None.
IMPRESSION: 1. Generalized cerebral atrophy.
2. Small chronic bilateral basal ganglia lacunar infarcts.
3. No acute intracranial abnormality.
